# Patient Record
Sex: Female | Born: 1995 | Race: White | Hispanic: No | Marital: Single | Smoking: Never smoker
Health system: Southern US, Community
[De-identification: ages and names within clinical notes are randomized; demographics above are authoritative.]

## PROBLEM LIST (undated history)

## (undated) DIAGNOSIS — F419 Anxiety disorder, unspecified: Secondary | ICD-10-CM

## (undated) DIAGNOSIS — F32A Depression, unspecified: Secondary | ICD-10-CM

## (undated) DIAGNOSIS — F329 Major depressive disorder, single episode, unspecified: Secondary | ICD-10-CM

## (undated) DIAGNOSIS — G43909 Migraine, unspecified, not intractable, without status migrainosus: Secondary | ICD-10-CM

## (undated) DIAGNOSIS — K219 Gastro-esophageal reflux disease without esophagitis: Secondary | ICD-10-CM

## (undated) DIAGNOSIS — F909 Attention-deficit hyperactivity disorder, unspecified type: Secondary | ICD-10-CM

## (undated) HISTORY — DX: Anxiety disorder, unspecified: F41.9

## (undated) HISTORY — DX: Depression, unspecified: F32.A

---
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
## 2015-06-09 ENCOUNTER — Telehealth (INDEPENDENT_AMBULATORY_CARE_PROVIDER_SITE_OTHER): Payer: Self-pay | Admitting: Internal Medicine

## 2015-06-09 DIAGNOSIS — J0120 Acute ethmoidal sinusitis, unspecified: Secondary | ICD-10-CM

## 2015-06-09 MED ORDER — LEVOFLOXACIN 500 MG PO TABS: 500.0000 mg | ORAL_TABLET | Freq: Every day | ORAL | Status: DC

## 2015-06-09 NOTE — Telephone Encounter (Signed)
Rx sent to her pharmacy 

## 2015-06-22 LAB — WBC
Granulocyte count absolute: 61
HCT: 37 (ref 29–41)
Hemoglobin: 12.1
MCH: 27
MCHC: 32.4
MCV: 83.5 (ref 76–111)
MPV: 9.4 fL (ref 7.5–11.5)
RBC: 4.48
RDW: 14.6
WBC: 6.2
platelet count: 241

## 2015-06-22 LAB — COMPREHENSIVE METABOLIC PANEL
ALT: 9 (ref 3–30)
AST: 14
Albumin: 4.5
Alkaline Phosphatase: 57
BUN: 10 (ref 4–21)
CO2: 26
Calcium: 9.5
Chloride: 107
Creat: 0.73
Glucose: 86
Potassium: 4.2
Sodium: 139
TSH: 1.046
Total Bilirubin: 0.6
Total Protein: 7.5 (ref 6.4–8.2)

## 2015-08-11 DIAGNOSIS — F419 Anxiety disorder, unspecified: Secondary | ICD-10-CM | POA: Diagnosis not present

## 2015-08-11 DIAGNOSIS — G43001 Migraine without aura, not intractable, with status migrainosus: Secondary | ICD-10-CM | POA: Diagnosis not present

## 2015-08-11 DIAGNOSIS — F329 Major depressive disorder, single episode, unspecified: Secondary | ICD-10-CM | POA: Diagnosis not present

## 2015-10-12 DIAGNOSIS — F909 Attention-deficit hyperactivity disorder, unspecified type: Secondary | ICD-10-CM | POA: Diagnosis not present

## 2015-10-12 DIAGNOSIS — F419 Anxiety disorder, unspecified: Secondary | ICD-10-CM | POA: Diagnosis not present

## 2015-10-12 DIAGNOSIS — G43909 Migraine, unspecified, not intractable, without status migrainosus: Secondary | ICD-10-CM | POA: Diagnosis not present

## 2015-10-12 DIAGNOSIS — F329 Major depressive disorder, single episode, unspecified: Secondary | ICD-10-CM | POA: Diagnosis not present

## 2015-10-15 ENCOUNTER — Telehealth (INDEPENDENT_AMBULATORY_CARE_PROVIDER_SITE_OTHER): Payer: Self-pay | Admitting: Internal Medicine

## 2015-10-15 DIAGNOSIS — J0120 Acute ethmoidal sinusitis, unspecified: Secondary | ICD-10-CM

## 2015-10-15 DIAGNOSIS — J020 Streptococcal pharyngitis: Secondary | ICD-10-CM

## 2015-10-15 MED ORDER — LEVOFLOXACIN 500 MG PO TABS: 500.0000 mg | ORAL_TABLET | Freq: Every day | ORAL | Status: DC

## 2015-10-15 NOTE — Telephone Encounter (Signed)
Rx for Levaquin sent to her pharmacy

## 2016-02-03 DIAGNOSIS — M25562 Pain in left knee: Secondary | ICD-10-CM | POA: Diagnosis not present

## 2016-02-06 DIAGNOSIS — M25562 Pain in left knee: Secondary | ICD-10-CM | POA: Diagnosis not present

## 2016-02-08 DIAGNOSIS — F900 Attention-deficit hyperactivity disorder, predominantly inattentive type: Secondary | ICD-10-CM | POA: Diagnosis not present

## 2016-02-08 DIAGNOSIS — F419 Anxiety disorder, unspecified: Secondary | ICD-10-CM | POA: Diagnosis not present

## 2016-02-08 DIAGNOSIS — G43909 Migraine, unspecified, not intractable, without status migrainosus: Secondary | ICD-10-CM | POA: Diagnosis not present

## 2016-04-29 ENCOUNTER — Telehealth (INDEPENDENT_AMBULATORY_CARE_PROVIDER_SITE_OTHER): Payer: Self-pay | Admitting: Internal Medicine

## 2016-04-29 MED ORDER — AZITHROMYCIN 250 MG PO TABS: ORAL_TABLET | ORAL | 0 refills | Status: DC

## 2016-05-04 NOTE — Telephone Encounter (Signed)
[email or web address] 

## 2016-08-02 ENCOUNTER — Encounter (INDEPENDENT_AMBULATORY_CARE_PROVIDER_SITE_OTHER): Payer: Self-pay

## 2016-08-02 ENCOUNTER — Encounter: Payer: Self-pay | Admitting: Adult Health

## 2016-08-02 ENCOUNTER — Ambulatory Visit (INDEPENDENT_AMBULATORY_CARE_PROVIDER_SITE_OTHER): Payer: 59 | Admitting: Adult Health

## 2016-08-02 VITALS — BP 122/60 | HR 78 | Ht 70.0 in | Wt 208.5 lb

## 2016-08-02 DIAGNOSIS — Z3202 Encounter for pregnancy test, result negative: Secondary | ICD-10-CM | POA: Diagnosis not present

## 2016-08-02 DIAGNOSIS — F329 Major depressive disorder, single episode, unspecified: Secondary | ICD-10-CM | POA: Diagnosis not present

## 2016-08-02 DIAGNOSIS — N946 Dysmenorrhea, unspecified: Secondary | ICD-10-CM | POA: Diagnosis not present

## 2016-08-02 DIAGNOSIS — N920 Excessive and frequent menstruation with regular cycle: Secondary | ICD-10-CM

## 2016-08-02 DIAGNOSIS — Z30011 Encounter for initial prescription of contraceptive pills: Secondary | ICD-10-CM

## 2016-08-02 DIAGNOSIS — F32A Depression, unspecified: Secondary | ICD-10-CM

## 2016-08-02 LAB — POCT URINE PREGNANCY: PREG TEST UR: NEGATIVE

## 2016-08-02 MED ORDER — NORETHIN-ETH ESTRAD-FE BIPHAS 1 MG-10 MCG / 10 MCG PO TABS: 1.0000 | ORAL_TABLET | Freq: Every day | ORAL | 4 refills | Status: DC

## 2016-08-02 NOTE — Patient Instructions (Signed)
Start lo loestrin today Use condoms Follow up with me in 3 months

## 2016-08-02 NOTE — Progress Notes (Signed)
Subjective:     Patient ID: Kim [name], female   DOB: 05-19-1996, 20 y.o.   MRN: [id number]  HPI Kim [name] is a 21 year old white female in complaining of heavy periods and cramps, and wants to start birth control pills. Periods sometimes last 2 weeks and she may change every 2-3 hours tampons and pads.[place]friend in [place], so not having sex at present..   Review of Systems +heavy periods with cramps Reviewed past medical,surgical, social and family history. Reviewed medications and allergies.     Objective:   Physical Exam BP 122/60 (BP Location: Left Arm, Patient Position: Sitting, Cuff Size: Large)   Pulse 78   Ht 5\' 10"  (1.778 m)   Wt 208 lb 8 oz (94.6 kg)   LMP 07/31/2016 (Exact Date)   BMI 29.92 kg/m UPT negative. Skin warm and dry. Neck: mid line trachea, normal thyroid, good ROM, no lymphadenopathy noted. Lungs: clear to ausculation bilaterally. Cardiovascular: regular rate and rhythm. PHQ 9 score 16, she is on Celexa from Dr Juanetta GoslingHawkins and denies any suicidal or homicidal ideations, seems worse with periods.   Discussed risk and benefits of OCs and will start with lo loestrin. Face time 20 minutes with 50% counseling.  Assessment:     1. Menorrhagia with regular cycle   2. Dysmenorrhea   3. Encounter for initial prescription of contraceptive pills   4. Depression, unspecified depression type   5. Pregnancy examination or test, negative result       Plan:     Rx lo loestrin disp 3 packs, with 4 refills, start today, 3 packs given lot [id number] A exp 3/18 Use condoms Follow up with me in 3 months

## 2016-10-07 ENCOUNTER — Ambulatory Visit (HOSPITAL_COMMUNITY)
Admission: EM | Admit: 2016-10-07 | Discharge: 2016-10-07 | Disposition: A | Discharge status: Home / Self Care | Payer: 59 | Attending: Internal Medicine | Admitting: Internal Medicine

## 2016-10-07 ENCOUNTER — Encounter (HOSPITAL_COMMUNITY): Payer: Self-pay | Admitting: Family Medicine

## 2016-10-07 DIAGNOSIS — R112 Nausea with vomiting, unspecified: Secondary | ICD-10-CM

## 2016-10-07 DIAGNOSIS — G43009 Migraine without aura, not intractable, without status migrainosus: Secondary | ICD-10-CM | POA: Diagnosis not present

## 2016-10-07 MED ORDER — PROMETHAZINE HCL 25 MG PO TABS: 25.0000 mg | ORAL_TABLET | Freq: Four times a day (QID) | ORAL | 0 refills | Status: DC | PRN

## 2016-10-07 MED ORDER — ONDANSETRON 4 MG PO TBDP
4.0000 mg | ORAL_TABLET | Freq: Once | ORAL | Status: AC
  Administered 2016-10-07: 4 mg via ORAL

## 2016-10-07 MED ORDER — SUMATRIPTAN SUCCINATE 25 MG PO TABS: 25.0000 mg | ORAL_TABLET | ORAL | 0 refills | Status: DC | PRN

## 2016-10-07 MED ORDER — SODIUM CHLORIDE 0.9 % IV BOLUS (SEPSIS)
1000.0000 mL | Freq: Once | INTRAVENOUS | Status: AC
  Administered 2016-10-07: 1000 mL via INTRAVENOUS

## 2016-10-07 MED ORDER — DEXAMETHASONE SODIUM PHOSPHATE 10 MG/ML IJ SOLN
10.0000 mg | Freq: Once | INTRAMUSCULAR | Status: AC
  Administered 2016-10-07: 10 mg via INTRAVENOUS

## 2016-10-07 MED ORDER — KETOROLAC TROMETHAMINE 30 MG/ML IJ SOLN
30.0000 mg | Freq: Once | INTRAMUSCULAR | Status: AC
  Administered 2016-10-07: 30 mg via INTRAVENOUS

## 2016-10-07 MED ORDER — DEXAMETHASONE SODIUM PHOSPHATE 10 MG/ML IJ SOLN
INTRAMUSCULAR | Status: AC
  Filled 2016-10-07: qty 1

## 2016-10-07 MED ORDER — KETOROLAC TROMETHAMINE 30 MG/ML IJ SOLN
INTRAMUSCULAR | Status: AC
Start: 2016-10-07 — End: 2016-10-07
  Filled 2016-10-07: qty 1

## 2016-10-07 MED ORDER — ONDANSETRON 4 MG PO TBDP
ORAL_TABLET | ORAL | Status: AC
  Filled 2016-10-07: qty 1

## 2016-10-07 MED ORDER — METOCLOPRAMIDE HCL 5 MG/ML IJ SOLN
5.0000 mg | Freq: Once | INTRAMUSCULAR | Status: AC
  Administered 2016-10-07: 5 mg via INTRAVENOUS

## 2016-10-07 MED ORDER — METOCLOPRAMIDE HCL 5 MG/ML IJ SOLN
INTRAMUSCULAR | Status: AC
  Filled 2016-10-07: qty 2

## 2016-10-07 MED FILL — SUMATRIPTAN SUCC 25 MG TAB: 25 | 30 days supply | Qty: 8 | Fill #0

## 2016-10-07 MED FILL — PROMETHAZINE 25 MG TABLET: 25 | 3 days supply | Qty: 10 | Fill #0

## 2016-10-07 NOTE — ED Triage Notes (Addendum)
Pt here for migraine that started yesterday. sts N,V, photophobia. Sts also numbness on the left side and hx of the same with migraines. sts she has tried Imitrex and tramadol for pain.

## 2016-10-07 NOTE — ED Provider Notes (Signed)
CSN: [id number]     Arrival date & time 10/07/16  1425 History   First MD Initiated Contact with Patient 10/07/16 1458     Chief Complaint  Patient presents with  . Migraine   (Consider location/radiation/quality/duration/timing/severity/associated sxs/prior Treatment) HPI Kim [name] is a 21 y.o. female presenting to UC with mother with c/o gradually worsening migraine headache that started yesterday.  Pain feels similar to prior migraines but more persistent, associated nausea and vomiting. Yesterday she took the last of her Imetrex and some tramadol, which helped with the pain enough for her to sleep through the night.  This morning pain was worse on the Left side of her head with some numbness to Left side of face, normal for pt. Pt had a CT scan and full workup by ED provider and her PCP last year when she had similar symptoms.  Mother notes pt has been under increased stress recently trying to complete her GED.  She took a test earlier in the week and learned she did not pass. Pt also started a new birth control OCP.  Mother thinks both of these factors have contributed to pt's current migraine.    Past Medical History:  Diagnosis Date  . ADHD (attention deficit hyperactivity disorder)   . Anxiety   . Migraines    History reviewed. No pertinent surgical history. Family History  Problem Relation Age of Onset  . Cancer Paternal Grandfather   . Cancer Paternal Grandmother   . Cancer Maternal Grandmother   . Cancer Maternal Grandfather   . Cancer Father     lung  . Hypertension Mother    Social History  Substance Use Topics  . Smoking status: Never Smoker  . Smokeless tobacco: Never Used  . Alcohol use No   OB History    Gravida Para Term Preterm AB Living   0 0 0 0 0 0   SAB TAB Ectopic Multiple Live Births   0 0 0 0 0     Review of Systems  Constitutional: Negative for chills and fever.  Eyes: Positive for photophobia. Negative for pain, discharge, redness and  visual disturbance.  Gastrointestinal: Positive for nausea and vomiting. Negative for abdominal pain and diarrhea.  Musculoskeletal: Negative for myalgias, neck pain and neck stiffness.  Skin: Negative for color change and wound.  Neurological: Positive for numbness and headaches. Negative for dizziness, syncope, facial asymmetry, weakness and light-headedness.    Allergies  Amoxicillin and Amoxicillin  Home Medications   Prior to Admission medications   Medication Sig Start Date End Date Taking? Authorizing Provider  acetaminophen (TYLENOL) 500 MG tablet Take 500 mg by mouth every 6 (six) hours as needed.    Historical Provider, MD  citalopram (CELEXA) 20 MG tablet Take 20 mg by mouth daily.    Historical Provider, MD  Norethindrone-Ethinyl Estradiol-Fe Biphas (LO LOESTRIN FE) 1 MG-10 MCG / 10 MCG tablet Take 1 tablet by mouth daily. Take 1 daily by mouth 08/02/16   Adline Potter, NP  promethazine (PHENERGAN) 25 MG tablet Take 1 tablet (25 mg total) by mouth every 6 (six) hours as needed for nausea or vomiting. 10/07/16   Junius Finner, PA-C  SUMAtriptan (IMITREX) 25 MG tablet Take 1 tablet (25 mg total) by mouth every 2 (two) hours as needed for migraine. May repeat in 2 hours once if headache persists or recurs. 10/07/16   Junius Finner, PA-C  UNABLE TO FIND daily. Vit B,C,D3,Zinc    Historical Provider, MD  Meds Ordered and Administered this Visit   Medications  ketorolac (TORADOL) 30 MG/ML injection 30 mg (30 mg Intravenous Given 10/07/16 1516)  metoCLOPramide (REGLAN) injection 5 mg (5 mg Intravenous Given 10/07/16 1515)  dexamethasone (DECADRON) injection 10 mg (10 mg Intravenous Given 10/07/16 1515)  sodium chloride 0.9 % bolus 1,000 mL (1,000 mLs Intravenous Given 10/07/16 1516)  ondansetron (ZOFRAN-ODT) disintegrating tablet 4 mg (4 mg Oral Given 10/07/16 1635)    BP 130/79   Pulse 90   Temp 98.6 F (37 C)   Resp 18   LMP 09/21/2016   SpO2 100%  No data  found.   Physical Exam  Constitutional: She is oriented to person, place, and time. She appears well-developed and well-nourished. She appears distressed.  Pt lying in darkened exam room on bed, appears uncomfortable but is awake and alert.  HENT:  Head: Normocephalic and atraumatic.  Right Ear: Tympanic membrane normal.  Left Ear: Tympanic membrane normal.  Nose: Nose normal.  Mouth/Throat: Uvula is midline, oropharynx is clear and moist and mucous membranes are normal.  Eyes: EOM are normal.  Neck: Normal range of motion. Neck supple.  No nuchal rigidity or meningeal signs.   Cardiovascular: Normal rate and regular rhythm.   Pulmonary/Chest: Effort normal and breath sounds normal. No respiratory distress. She has no wheezes. She has no rales.  Abdominal: Soft. She exhibits no distension. There is no tenderness.  Musculoskeletal: Normal range of motion.  Neurological: She is alert and oriented to person, place, and time.  Skin: Skin is warm and dry. She is not diaphoretic.  Psychiatric: She has a normal mood and affect. Her behavior is normal.  Nursing note and vitals reviewed.   Urgent Care Course     Procedures (including critical care time)  Labs Review Labs Reviewed - No data to display  Imaging Review No results found.    MDM   1. Migraine without aura and without status migrainosus, not intractable   2. Nausea and vomiting in adult patient    Hx and exam c/w migraine headache.  Migraine cocktail of Toradol , Reglan , and Decadron  given in UC with 1L IV fluids. Pt allowed to rest in cool dark room.   4:30 PM pt is feeling better now. HA is nearly resolved but nausea still present.  Zofran given Able to keep down several ounces of PO fluids and crackers  Mother comfortable bringing pt home. Rx: Sumatriptan (8 tabs) refilled until able to f/u with PCP or neurologist. Phenergan also prescribed for nausea.  Discussed symptoms that warrant  emergent care in the ED.     Junius Finner, PA-C 10/07/16 1655

## 2016-10-26 DIAGNOSIS — H5213 Myopia, bilateral: Secondary | ICD-10-CM | POA: Diagnosis not present

## 2016-10-27 ENCOUNTER — Telehealth (INDEPENDENT_AMBULATORY_CARE_PROVIDER_SITE_OTHER): Payer: Self-pay | Admitting: Internal Medicine

## 2016-10-27 DIAGNOSIS — B379 Candidiasis, unspecified: Secondary | ICD-10-CM

## 2016-10-27 MED ORDER — FLUCONAZOLE 100 MG PO TABS: 100.0000 mg | ORAL_TABLET | Freq: Every day | ORAL | 0 refills | Status: DC

## 2016-10-27 NOTE — Telephone Encounter (Signed)
Rx sent 

## 2016-10-28 ENCOUNTER — Telehealth: Payer: Self-pay | Admitting: Adult Health

## 2016-10-28 NOTE — Telephone Encounter (Signed)
Informed pt she can come by here and pick up 1 month sample and also RX was sent to pharmacy back in January.  Pt verbalized understanding.

## 2016-10-31 ENCOUNTER — Ambulatory Visit: Payer: 59 | Admitting: Adult Health

## 2016-11-07 ENCOUNTER — Encounter: Payer: Self-pay | Admitting: Adult Health

## 2016-11-07 ENCOUNTER — Ambulatory Visit (INDEPENDENT_AMBULATORY_CARE_PROVIDER_SITE_OTHER): Payer: 59 | Admitting: Adult Health

## 2016-11-07 VITALS — BP 126/70 | HR 79 | Ht 71.0 in | Wt 206.0 lb

## 2016-11-07 DIAGNOSIS — Z308 Encounter for other contraceptive management: Secondary | ICD-10-CM

## 2016-11-07 DIAGNOSIS — Z7689 Persons encountering health services in other specified circumstances: Secondary | ICD-10-CM

## 2016-11-07 NOTE — Progress Notes (Signed)
Subjective:     Patient ID: Beverely Low, female   DOB: [birth year], 21 y.o.   MRN: [id number]  HPI Roanne is a 21 year old white female, back in follow up of starting lo loestrin in January and periods much better.   Review of Systems Periods better Reviewed past medical,surgical, social and family history. Reviewed medications and allergies.     Objective:   Physical Exam BP 126/70 (BP Location: Right Arm, Patient Position: Sitting, Cuff Size: Normal)   Pulse 79   Ht  (1.803 m)   Wt 206 lb (93.4 kg)   LMP 10/20/2016 (Approximate)   BMI 28.73 kg/m    Has been on lo loestrin since end of January and periods much better, but ran out of pills and forgot to go get at pharmacy.  Assessment:     1. Encounter for menstrual regulation       Plan:     Continue lo loestrin  Return in 3 months for pap and physical

## 2017-02-06 ENCOUNTER — Other Ambulatory Visit: Payer: 59 | Admitting: Adult Health

## 2017-02-24 ENCOUNTER — Other Ambulatory Visit: Payer: 59 | Admitting: Adult Health

## 2017-02-28 DIAGNOSIS — R112 Nausea with vomiting, unspecified: Secondary | ICD-10-CM | POA: Diagnosis not present

## 2017-02-28 DIAGNOSIS — G43909 Migraine, unspecified, not intractable, without status migrainosus: Secondary | ICD-10-CM | POA: Diagnosis not present

## 2017-03-27 ENCOUNTER — Other Ambulatory Visit (HOSPITAL_COMMUNITY)
Admission: RE | Admit: 2017-03-27 | Discharge: 2017-03-27 | Disposition: A | Discharge status: Home / Self Care | Payer: 59 | Source: Ambulatory Visit | Attending: Adult Health | Admitting: Adult Health

## 2017-03-27 ENCOUNTER — Encounter: Payer: Self-pay | Admitting: Adult Health

## 2017-03-27 ENCOUNTER — Ambulatory Visit (INDEPENDENT_AMBULATORY_CARE_PROVIDER_SITE_OTHER): Payer: 59 | Admitting: Adult Health

## 2017-03-27 VITALS — BP 120/70 | HR 74 | Ht 69.2 in | Wt 198.0 lb

## 2017-03-27 DIAGNOSIS — Z01419 Encounter for gynecological examination (general) (routine) without abnormal findings: Secondary | ICD-10-CM

## 2017-03-27 DIAGNOSIS — F329 Major depressive disorder, single episode, unspecified: Secondary | ICD-10-CM | POA: Diagnosis not present

## 2017-03-27 DIAGNOSIS — R1032 Left lower quadrant pain: Secondary | ICD-10-CM | POA: Diagnosis not present

## 2017-03-27 DIAGNOSIS — Z3041 Encounter for surveillance of contraceptive pills: Secondary | ICD-10-CM | POA: Diagnosis not present

## 2017-03-27 DIAGNOSIS — Z01411 Encounter for gynecological examination (general) (routine) with abnormal findings: Secondary | ICD-10-CM | POA: Diagnosis not present

## 2017-03-27 DIAGNOSIS — Z0001 Encounter for general adult medical examination with abnormal findings: Secondary | ICD-10-CM | POA: Insufficient documentation

## 2017-03-27 DIAGNOSIS — F32A Depression, unspecified: Secondary | ICD-10-CM

## 2017-03-27 HISTORY — DX: Encounter for surveillance of contraceptive pills: Z30.41

## 2017-03-27 HISTORY — DX: Encounter for gynecological examination (general) (routine) without abnormal findings: Z01.419

## 2017-03-27 MED ORDER — NORETHIN-ETH ESTRAD-FE BIPHAS 1 MG-10 MCG / 10 MCG PO TABS: 1.0000 | ORAL_TABLET | Freq: Every day | ORAL | 4 refills | Status: DC

## 2017-03-27 NOTE — Patient Instructions (Signed)
Call PCP about meds Return in 1 week for [place]  Physical in 1 year  Pap in 3 if normal

## 2017-03-27 NOTE — Progress Notes (Signed)
Patient ID: Kim [name], female   DOB: [birth year], 21 y.o.   MRN: [id number] History of Present Illness: Kim [name] is a 21 year old white female in for well woman gyn exam and first pap. PCP is Dr Kim [name]   Current Medications, Allergies, Past Medical History, Past Surgical History, Family History and Social History were reviewed in [employer] electronic medical record.     Review of Systems:  Patient denies any headaches, hearing loss, fatigue, blurred vision, shortness of breath, chest pain, problems with bowel movements, urination, or intercourse. No joint pain or mood swings. Has some pain LLQ, long standing.   Physical Exam:BP 120/70 (BP Location: Left Arm, Patient Position: Sitting, Cuff Size: Small)   Pulse 74   Ht 5' 9.2" (1.758 m)   Wt 198 lb (89.8 kg)   LMP 03/21/2017   BMI 29.07 kg/m  General:  Well developed, well nourished, no acute distress Skin:  Warm and dry Neck:  Midline trachea, normal thyroid, good ROM, no lymphadenopathy Lungs; Clear to auscultation bilaterally Breast:  No dominant palpable mass, retraction, or nipple discharge Cardiovascular: Regular rate and rhythm Abdomen:  Soft, non tender, no hepatosplenomegaly Pelvic:  External genitalia is normal in appearance, no lesions.  The vagina is normal in appearance. Urethra has no lesions or masses. The cervix is smooth, pap with GC/CHL performed.  Uterus is felt to be normal size, shape, and contour.  No adnexal masses, LLQ  tenderness noted.Bladder is non tender, no masses felt. Extremities/musculoskeletal:  No swelling or varicosities noted, no clubbing or cyanosis Psych:  No mood changes, alert and cooperative,seems happy PHQ 9 score 17, is on meds, from PCP, is not suicidal. Will get [place] to evaluate LLQ.  Impression:  1. Encounter for gynecological examination with Papanicolaou smear of cervix   2. Encounter for surveillance of contraceptive pills   3. LLQ pain   4. Depression, unspecified  depression type      Plan: Meds ordered this encounter  Medications  . Norethindrone-Ethinyl Estradiol-Fe Biphas (LO LOESTRIN FE) 1 MG-10 MCG / 10 MCG tablet    Sig: Take 1 tablet by mouth daily. Take 1 daily by mouth    Dispense:  3 Package    Refill:  4    BIN [id number], PCN [id number], GRP [id number]    Order Specific Question:   Supervising Provider    Answer:   Lazaro Arms [[id number]]   Call PCP about meds Return in 1 week for [place]  Physical in 1 year  Pap in 3 if normal

## 2017-03-27 NOTE — Addendum Note (Signed)
Addended by: Federico Flake A on: 03/27/2017 04:50 PM   Modules accepted: Orders

## 2017-03-29 LAB — CYTOLOGY - PAP
CHLAMYDIA, DNA PROBE: NEGATIVE
DIAGNOSIS: NEGATIVE
NEISSERIA GONORRHEA: NEGATIVE

## 2017-03-31 ENCOUNTER — Telehealth: Payer: Self-pay | Admitting: *Deleted

## 2017-03-31 NOTE — Telephone Encounter (Signed)
Pt's mom called stating Lo Loestrin requires a PA. I haven't seen a PA from pharmacy yet. I gave 2 sample boxes of Lo Loestrin. Lot # [id number] A exp 1/20. Pt's mom to come by and pick up today at front desk. JSY

## 2017-04-03 ENCOUNTER — Ambulatory Visit (INDEPENDENT_AMBULATORY_CARE_PROVIDER_SITE_OTHER): Payer: 59

## 2017-04-03 DIAGNOSIS — R1032 Left lower quadrant pain: Secondary | ICD-10-CM

## 2017-04-03 NOTE — Progress Notes (Signed)
PELVIC US TA/TV: homogeneous anteverted uterus,wnl,normal ovaries bilat,unable to slide left ovary w/probe pressure,right ovary appears mobile,no free fluid,bilat adnexal pain during ultrasound,EEC 2.2 mm

## 2017-04-04 ENCOUNTER — Telehealth: Payer: Self-pay | Admitting: Adult Health

## 2017-04-04 MED ORDER — CITALOPRAM HYDROBROMIDE 20 MG PO TABS: ORAL_TABLET | ORAL | 3 refills | Status: DC

## 2017-04-04 NOTE — Telephone Encounter (Signed)
Pt aware [place] is normal, she says PCP out of town and she is on celexa and needs refills and may need increase in dose, will rx celexa 20 mg take 1 1/2 tabs daily and let me know how it is working

## 2017-04-19 ENCOUNTER — Telehealth: Payer: Self-pay | Admitting: Adult Health

## 2017-04-19 NOTE — Telephone Encounter (Signed)
Pt to come get samples

## 2017-07-06 ENCOUNTER — Telehealth: Payer: Self-pay | Admitting: Adult Health

## 2017-07-06 NOTE — Telephone Encounter (Signed)
Patient called stating that she would like a call back from Jennifer, Pt did not state the reason for the call. Please contact pt °

## 2017-07-06 NOTE — Telephone Encounter (Signed)
Patient states she has taken "Plan B" today after she and her boyfriend had unprotected sex. She takes her BC regularly at the same time daily and is due for her period in 3 days. She had questions regarding the medication which were answered and advised just to let us know if she needed to be seen. Verbalized understanding.

## 2017-09-22 DIAGNOSIS — Z01419 Encounter for gynecological examination (general) (routine) without abnormal findings: Secondary | ICD-10-CM | POA: Diagnosis not present

## 2017-09-22 DIAGNOSIS — Z124 Encounter for screening for malignant neoplasm of cervix: Secondary | ICD-10-CM | POA: Diagnosis not present

## 2017-09-26 LAB — HM PAP SMEAR

## 2017-10-12 DIAGNOSIS — Z3202 Encounter for pregnancy test, result negative: Secondary | ICD-10-CM | POA: Diagnosis not present

## 2017-10-12 DIAGNOSIS — Z30430 Encounter for insertion of intrauterine contraceptive device: Secondary | ICD-10-CM | POA: Diagnosis not present

## 2017-11-10 ENCOUNTER — Other Ambulatory Visit: Payer: Self-pay | Admitting: Adult Health

## 2017-11-14 DIAGNOSIS — Z30431 Encounter for routine checking of intrauterine contraceptive device: Secondary | ICD-10-CM | POA: Diagnosis not present

## 2018-02-19 ENCOUNTER — Telehealth (INDEPENDENT_AMBULATORY_CARE_PROVIDER_SITE_OTHER): Payer: Self-pay | Admitting: Internal Medicine

## 2018-02-19 DIAGNOSIS — K588 Other irritable bowel syndrome: Secondary | ICD-10-CM

## 2018-02-19 MED ORDER — DICYCLOMINE HCL 10 MG PO CAPS: 10.0000 mg | ORAL_CAPSULE | Freq: Two times a day (BID) | ORAL | 1 refills | Status: DC

## 2018-02-19 NOTE — Telephone Encounter (Signed)
Rx for Dicyclomine sent to her pharmacy.  

## 2018-03-16 ENCOUNTER — Telehealth (INDEPENDENT_AMBULATORY_CARE_PROVIDER_SITE_OTHER): Payer: Self-pay | Admitting: Internal Medicine

## 2018-03-16 DIAGNOSIS — J020 Streptococcal pharyngitis: Secondary | ICD-10-CM

## 2018-03-16 MED ORDER — LEVOFLOXACIN 500 MG PO TABS: 500.0000 mg | ORAL_TABLET | Freq: Every day | ORAL | 0 refills | Status: DC

## 2018-03-16 NOTE — Telephone Encounter (Signed)
Rx sent to her pharmacy 

## 2018-04-27 ENCOUNTER — Encounter (HOSPITAL_COMMUNITY): Payer: Self-pay | Admitting: Emergency Medicine

## 2018-04-27 ENCOUNTER — Other Ambulatory Visit: Payer: Self-pay

## 2018-04-27 ENCOUNTER — Emergency Department (HOSPITAL_COMMUNITY)
Admission: EM | Admit: 2018-04-27 | Discharge: 2018-04-28 | Disposition: A | Discharge status: Home / Self Care | Payer: 59 | Attending: Emergency Medicine | Admitting: Emergency Medicine

## 2018-04-27 DIAGNOSIS — R0789 Other chest pain: Secondary | ICD-10-CM

## 2018-04-27 DIAGNOSIS — M940 Chondrocostal junction syndrome [Tietze]: Secondary | ICD-10-CM | POA: Diagnosis not present

## 2018-04-27 DIAGNOSIS — Z79899 Other long term (current) drug therapy: Secondary | ICD-10-CM | POA: Insufficient documentation

## 2018-04-27 DIAGNOSIS — F419 Anxiety disorder, unspecified: Secondary | ICD-10-CM | POA: Insufficient documentation

## 2018-04-27 DIAGNOSIS — F909 Attention-deficit hyperactivity disorder, unspecified type: Secondary | ICD-10-CM | POA: Diagnosis not present

## 2018-04-27 MED ORDER — CYCLOBENZAPRINE HCL 10 MG PO TABS
5.0000 mg | ORAL_TABLET | Freq: Once | ORAL | Status: AC
  Administered 2018-04-27: 5 mg via ORAL
  Filled 2018-04-27: qty 1

## 2018-04-27 MED ORDER — IBUPROFEN 400 MG PO TABS
600.0000 mg | ORAL_TABLET | Freq: Once | ORAL | Status: AC
  Administered 2018-04-27: 600 mg via ORAL
  Filled 2018-04-27: qty 2

## 2018-04-27 NOTE — ED Triage Notes (Addendum)
Pt c/o sudden chest stabbing to left side with lightheadedness with getting up. Pt states had syncopal episode. Witness states was out about 10 seconds . Pt a/o. Non diaphoretic. Pt anxious in triage

## 2018-04-28 DIAGNOSIS — F419 Anxiety disorder, unspecified: Secondary | ICD-10-CM | POA: Diagnosis not present

## 2018-04-28 DIAGNOSIS — Z79899 Other long term (current) drug therapy: Secondary | ICD-10-CM | POA: Diagnosis not present

## 2018-04-28 DIAGNOSIS — F909 Attention-deficit hyperactivity disorder, unspecified type: Secondary | ICD-10-CM | POA: Diagnosis not present

## 2018-04-28 DIAGNOSIS — M940 Chondrocostal junction syndrome [Tietze]: Secondary | ICD-10-CM | POA: Diagnosis not present

## 2018-04-28 LAB — COMPREHENSIVE METABOLIC PANEL
ALT: 11 U/L (ref 0–44)
ANION GAP: 5 (ref 5–15)
AST: 14 U/L — ABNORMAL LOW (ref 15–41)
Albumin: 4.2 g/dL (ref 3.5–5.0)
Alkaline Phosphatase: 53 U/L (ref 38–126)
BILIRUBIN TOTAL: 0.6 mg/dL (ref 0.3–1.2)
BUN: 9 mg/dL (ref 6–20)
CHLORIDE: 108 mmol/L (ref 98–111)
CO2: 25 mmol/L (ref 22–32)
Calcium: 8.8 mg/dL — ABNORMAL LOW (ref 8.9–10.3)
Creatinine, Ser: 0.62 mg/dL (ref 0.44–1.00)
GFR calc Af Amer: >60 mL/min (ref >60)
Glucose, Bld: 105 mg/dL — ABNORMAL HIGH (ref 70–99)
POTASSIUM: 3.7 mmol/L (ref 3.5–5.1)
Sodium: 138 mmol/L (ref 135–145)
TOTAL PROTEIN: 7.4 g/dL (ref 6.5–8.1)

## 2018-04-28 LAB — CBC WITH DIFFERENTIAL/PLATELET
ABS IMMATURE GRANULOCYTES: 0.02 10*3/uL (ref 0.00–0.07)
BASOS ABS: 0 10*3/uL (ref 0.0–0.1)
BASOS PCT: 1 %
Eosinophils Absolute: 0.1 10*3/uL (ref 0.0–0.5)
Eosinophils Relative: 1 %
HCT: 37.8 % (ref 36.0–46.0)
Hemoglobin: 12.3 g/dL (ref 12.0–15.0)
IMMATURE GRANULOCYTES: 0 %
LYMPHS PCT: 24 %
Lymphs Abs: 2.1 10*3/uL (ref 0.7–4.0)
MCH: 29.4 pg (ref 26.0–34.0)
MCHC: 32.5 g/dL (ref 30.0–36.0)
MCV: 90.2 fL (ref 80.0–100.0)
Monocytes Absolute: 0.4 10*3/uL (ref 0.1–1.0)
Monocytes Relative: 5 %
NEUTROS ABS: 6.1 10*3/uL (ref 1.7–7.7)
NEUTROS PCT: 69 %
NRBC: 0 % (ref 0.0–0.2)
PLATELETS: 236 10*3/uL (ref 150–400)
RBC: 4.19 MIL/uL (ref 3.87–5.11)
RDW: 13 % (ref 11.5–15.5)
WBC: 8.7 10*3/uL (ref 4.0–10.5)

## 2018-04-28 LAB — D-DIMER, QUANTITATIVE (NOT AT ARMC)

## 2018-04-28 LAB — TROPONIN I

## 2018-04-28 NOTE — Discharge Instructions (Signed)
Use ice and heat for comfort.  Take ibuprofen 600 mg 4 times a day as needed for your chest wall pain.  This is a inflammation of the joints near your breastbone in your chest.  It is not a sign of anything bad such as a problem with your lungs or your heart, it is an aggravation because it is painful.  It usually comes out in times of stress.  You need to talk to your primary care provider about your Celexa and see if you need to change your antidepressant or increase your dose.

## 2018-04-28 NOTE — ED Provider Notes (Signed)
[facility] EMERGENCY DEPARTMENT Provider Note   CSN: [id number] Arrival date & time: 04/27/18  2235  Time seen 23:30 PM    History   Chief Complaint Chief Complaint  Patient presents with  . Chest Pain    HPI Kim [name] is a 22 y.o. female.  HPI patient states today she and her boyfriend went to [facility] and she felt cold, on the way home she felt cold and continued to feel cold when she got home.  About 9:30 PM she started complaining of left anterior chest pain.  She states the pain is sharp and jabbing and only last for a few seconds to a minute.  She does not have any constant pain.  She states she feels short of breath afterwards only last few seconds.  She had nausea without vomiting or diaphoresis.  She states she feels hot and feels like her face is flushed.  She states she gets swelling of her left knee and ankle from an old injury otherwise she denies any pain or swelling her legs.  She states she has been having this chest pain with physical activity for the past couple months.  She states it is in the same location and only last a few seconds.  She denies any recent travel or being in bed rest.  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  She states she has a history of depression but she does not have a psychiatrist or therapist.  She states her primary care doctor is manage it.  She has been on Celexa for about 2 years and feels like maybe her dose needs to be adjusted.  Patient denies being on hormones but she she does have a IUD.  Patient states she had a "bad panic attack" last weekend and was "physically sick".  PCP Patient, No Pcp Per   Past Medical History:  Diagnosis Date  . ADHD (attention deficit hyperactivity disorder)    . Anxiety   . Migraines     Patient Active Problem List   Diagnosis Date Noted  . Depression 03/27/2017  . Encounter for surveillance of contraceptive pills 03/27/2017  . Encounter for gynecological examination with Papanicolaou smear of cervix 03/27/2017    History reviewed. No pertinent surgical history.   OB History    Gravida  0   Para  0   Term  0   Preterm  0   AB  0   Living  0     SAB  0   TAB  0   Ectopic  0   Multiple  0   Live Births  0            Home Medications    Celexa, IUD  Prior to Admission medications   Medication Sig Start Date End Date Taking? Authorizing Provider  acetaminophen (TYLENOL) 500 MG tablet Take 500 mg by mouth every 6 (six) hours as needed.   Yes [provider]  citalopram (CELEXA) 20 MG tablet Take 1 1/2 tablets daily 04/04/17  Yes Cyril Mourning A, NP  dicyclomine (BENTYL) 10 MG capsule Take 1 capsule (10 mg total) by mouth 2 (two) times daily before a meal. 02/19/18   Setzer, Brand Males, NP  levofloxacin (LEVAQUIN) 500 MG tablet Take 1 tablet (500 mg total) by mouth daily. 03/16/18   Setzer, Brand Males, NP  Norethindrone-Ethinyl Estradiol-Fe Biphas (LO LOESTRIN FE) 1 MG-10 MCG / 10 MCG tablet Take 1 tablet by mouth daily. Take 1 daily by mouth 03/27/17   Adline Potter, NP  SUMAtriptan (IMITREX) 25 MG tablet Take 1 tablet (25 mg total) by mouth every 2 (two) hours as needed for migraine. May repeat in 2 hours once if headache persists or recurs. 10/07/16   Lurene Shadow, PA-C  UNABLE TO FIND daily. Vit B,C,D3,Zinc    [provider]  metoCLOPramide (REGLAN) 10 MG tablet Take 1 tablet (10 mg total) by mouth every 6 (six) hours as needed (take as needed for nausea or headache). 09/12/11 03/31/14  Samuel Jester, DO    Family History Family History  Problem Relation Age of Onset  . Cancer Paternal Grandfather   . Alzheimer's disease Paternal Grandmother   . Cancer Maternal Grandmother        breast     . Cancer Maternal Grandfather        pancreatic and bone  . Cancer Father        lung  . Hypertension Mother     Social History Social History   Tobacco Use  . Smoking status: Never Smoker  . Smokeless tobacco: Never Used  Substance Use Topics  . Alcohol use: Yes    Comment: monthly  . Drug use: No  quit community college last week Uses E cigarettes  Allergies   Amoxicillin and Amoxicillin   Review of Systems Review of Systems  All other systems reviewed and are negative.    Physical Exam Updated Vital Signs BP (!) 169/91   Pulse (!) 115   Temp 97.7 F (36.5 C) (Oral)   Resp 16   LMP 04/27/2018   SpO2 100%   Vital signs normal except for hypertension and tachycardia   Physical Exam  Constitutional: She is oriented to person, place, and time. She appears well-developed and well-nourished.  Non-toxic appearance. She does not appear ill. No distress.  HENT:  Head: Normocephalic and atraumatic.  Right Ear: External ear normal.  Left Ear: External ear normal.  Nose: Nose normal. No mucosal edema or rhinorrhea.  Mouth/Throat: Oropharynx is clear and moist and mucous membranes are normal. No dental abscesses or uvula swelling.  Eyes: Pupils are equal, round, and reactive to light. Conjunctivae and EOM are normal.  Neck: Normal range of motion and full passive range of motion without pain. Neck supple.  Cardiovascular: Regular rhythm and normal heart sounds. Tachycardia present. Exam reveals no gallop and no friction rub.  No murmur heard. Pulmonary/Chest: Effort normal and breath sounds normal. No respiratory distress. She has no wheezes. She has no rhonchi. She has no rales. She exhibits no tenderness and no crepitus.  Patient is tender over bilateral costochondral junctions in her left mid anterior chest wall.    Abdominal: Soft. Normal appearance and bowel sounds are normal. She exhibits no distension. There is no tenderness. There is no rebound and no  guarding.  Musculoskeletal: Normal range of motion. She exhibits no edema or tenderness.  Moves all extremities well.   Neurological: She is alert and oriented to person, place, and time. She has normal strength. No cranial nerve deficit.  Skin: Skin is warm, dry and intact. No rash noted. No erythema. No pallor.  Psychiatric: Her behavior is normal. Her mood appears anxious. Her speech is rapid  and/or pressured.  Nursing note and vitals reviewed.    ED Treatments / Results  Labs (all labs ordered are listed, but only abnormal results are displayed) Results for orders placed or performed during the hospital encounter of 04/27/18  Comprehensive metabolic panel  Result Value Ref Range   Sodium 138 135 - 145 mmol/L   Potassium 3.7 3.5 - 5.1 mmol/L   Chloride 108 98 - 111 mmol/L   CO2 25 22 - 32 mmol/L   Glucose, Bld 105 (H) 70 - 99 mg/dL   BUN 9 6 - 20 mg/dL   Creatinine, Ser 4.09 0.44 - 1.00 mg/dL   Calcium 8.8 (L) 8.9 - 10.3 mg/dL   Total Protein 7.4 6.5 - 8.1 g/dL   Albumin 4.2 3.5 - 5.0 g/dL   AST 14 (L) 15 - 41 U/L   ALT 11 0 - 44 U/L   Alkaline Phosphatase 53 38 - 126 U/L   Total Bilirubin 0.6 0.3 - 1.2 mg/dL   GFR calc non Af Amer >60 >60 mL/min   GFR calc Af Amer >60 >60 mL/min   Anion gap 5 5 - 15  CBC with Differential  Result Value Ref Range   WBC 8.7 4.0 - 10.5 K/uL   RBC 4.19 3.87 - 5.11 MIL/uL   Hemoglobin 12.3 12.0 - 15.0 g/dL   HCT 81.1 91.4 - 78.2 %   MCV 90.2 80.0 - 100.0 fL   MCH 29.4 26.0 - 34.0 pg   MCHC 32.5 30.0 - 36.0 g/dL   RDW 95.6 21.3 - 08.6 %   Platelets 236 150 - 400 K/uL   nRBC 0.0 0.0 - 0.2 %   Neutrophils Relative % 69 %   Neutro Abs 6.1 1.7 - 7.7 K/uL   Lymphocytes Relative 24 %   Lymphs Abs 2.1 0.7 - 4.0 K/uL   Monocytes Relative 5 %   Monocytes Absolute 0.4 0.1 - 1.0 K/uL   Eosinophils Relative 1 %   Eosinophils Absolute 0.1 0.0 - 0.5 K/uL   Basophils Relative 1 %   Basophils Absolute 0.0 0.0 - 0.1 K/uL   Immature Granulocytes 0  %   Abs Immature Granulocytes 0.02 0.00 - 0.07 K/uL  Troponin I  Result Value Ref Range   Troponin I <0.03 <0.03 ng/mL  D-dimer, quantitative  Result Value Ref Range   D-Dimer, Quant <0.27 0.00 - 0.50 ug/mL-FEU   Laboratory interpretation all normal    EKG EKG Interpretation  Date/Time:  Friday April 27 2018 23:00:14 EDT Ventricular Rate:  125 PR Interval:    QRS Duration: 92 QT Interval:  310 QTC Calculation: 447 R Axis:   106 Text Interpretation:  Sinus tachycardia Consider right ventricular hypertrophy no prior to compare with Confirmed by Meridee Score [phone]) on 04/27/2018 11:05:01 PM   Radiology No results found.  Procedures Procedures (including critical care time)  Medications Ordered in ED Medications  ibuprofen (ADVIL,MOTRIN) tablet 600 mg (600 mg Oral Given 04/27/18 2349)  cyclobenzaprine (FLEXERIL) tablet 5 mg (5 mg Oral Given 04/27/18 2349)     Initial Impression / Assessment and Plan / ED Course  I have reviewed the triage vital signs and the nursing notes.  Pertinent labs & imaging results that were available during my care of the patient were reviewed by me and considered in my medical decision making (see chart for details).    Patient was given ibuprofen and Flexeril for chest wall pain.  She seems to be very anxious.  Laboratory testing was  done including a d-dimer although she does not have risk factors for DVT or PE.  Patient will need to follow-up with her primary care provider about her anxiety and dose of her Celexa.  She can take nonsteroidal anti-inflammatory drugs over-the-counter for her chest wall pain.  1:30 AM patient states her pain is better.  She was discharged home.  Final Clinical Impressions(s) / ED Diagnoses   Final diagnoses:  Chest wall pain  Costochondritis, acute  Anxiety    ED Discharge Orders    None    OTC ibuprofen   Plan discharge  Devoria Albe, MD, Concha Pyo, MD 04/28/18 [phone]

## 2018-07-13 DIAGNOSIS — N390 Urinary tract infection, site not specified: Secondary | ICD-10-CM | POA: Diagnosis not present

## 2018-07-18 DIAGNOSIS — N760 Acute vaginitis: Secondary | ICD-10-CM | POA: Diagnosis not present

## 2018-07-18 DIAGNOSIS — Z113 Encounter for screening for infections with a predominantly sexual mode of transmission: Secondary | ICD-10-CM | POA: Diagnosis not present

## 2018-09-18 ENCOUNTER — Encounter (HOSPITAL_COMMUNITY): Payer: Self-pay | Admitting: Emergency Medicine

## 2018-09-18 ENCOUNTER — Ambulatory Visit (HOSPITAL_COMMUNITY)
Admission: EM | Admit: 2018-09-18 | Discharge: 2018-09-18 | Disposition: A | Discharge status: Home / Self Care | Payer: 59 | Attending: Emergency Medicine | Admitting: Emergency Medicine

## 2018-09-18 DIAGNOSIS — G43009 Migraine without aura, not intractable, without status migrainosus: Secondary | ICD-10-CM

## 2018-09-18 MED ORDER — METOCLOPRAMIDE HCL 5 MG/ML IJ SOLN
10.0000 mg | Freq: Once | INTRAMUSCULAR | Status: AC
Start: 2018-09-18 — End: 2018-09-18
  Administered 2018-09-18: 10 mg via INTRAMUSCULAR

## 2018-09-18 MED ORDER — DIPHENHYDRAMINE HCL 50 MG/ML IJ SOLN
INTRAMUSCULAR | Status: AC
  Filled 2018-09-18: qty 1

## 2018-09-18 MED ORDER — ONDANSETRON 8 MG PO TBDP: ORAL_TABLET | ORAL | 0 refills | Status: DC

## 2018-09-18 MED ORDER — SUMATRIPTAN SUCCINATE 6 MG/0.5ML ~~LOC~~ SOLN
SUBCUTANEOUS | Status: AC
  Filled 2018-09-18: qty 0.5

## 2018-09-18 MED ORDER — SUMATRIPTAN SUCCINATE 6 MG/0.5ML ~~LOC~~ SOLN
6.0000 mg | Freq: Once | SUBCUTANEOUS | Status: AC
  Administered 2018-09-18: 6 mg via SUBCUTANEOUS

## 2018-09-18 MED ORDER — KETOROLAC TROMETHAMINE 30 MG/ML IJ SOLN
30.0000 mg | Freq: Once | INTRAMUSCULAR | Status: AC
  Administered 2018-09-18: 30 mg via INTRAMUSCULAR

## 2018-09-18 MED ORDER — DEXAMETHASONE SODIUM PHOSPHATE 10 MG/ML IJ SOLN
10.0000 mg | Freq: Once | INTRAMUSCULAR | Status: AC
  Administered 2018-09-18: 10 mg via INTRAMUSCULAR

## 2018-09-18 MED ORDER — DEXAMETHASONE SODIUM PHOSPHATE 10 MG/ML IJ SOLN
INTRAMUSCULAR | Status: AC
  Filled 2018-09-18: qty 1

## 2018-09-18 MED ORDER — SUMATRIPTAN 5 MG/ACT NA SOLN: 1.0000 | NASAL | 0 refills | Status: DC | PRN

## 2018-09-18 MED ORDER — METOCLOPRAMIDE HCL 5 MG/ML IJ SOLN
INTRAMUSCULAR | Status: AC
  Filled 2018-09-18: qty 2

## 2018-09-18 MED ORDER — IBUPROFEN 600 MG PO TABS: 600.0000 mg | ORAL_TABLET | Freq: Four times a day (QID) | ORAL | 0 refills | Status: DC | PRN

## 2018-09-18 MED ORDER — KETOROLAC TROMETHAMINE 60 MG/2ML IM SOLN
INTRAMUSCULAR | Status: AC
  Filled 2018-09-18: qty 2

## 2018-09-18 MED ORDER — DIPHENHYDRAMINE HCL 50 MG/ML IJ SOLN
25.0000 mg | Freq: Once | INTRAMUSCULAR | Status: AC
  Administered 2018-09-18: 25 mg via INTRAMUSCULAR

## 2018-09-18 MED ORDER — BUTALBITAL-APAP-CAFFEINE 50-325-40 MG PO TABS: 1.0000 | ORAL_TABLET | ORAL | 0 refills | Status: DC | PRN

## 2018-09-18 MED ORDER — DIPHENHYDRAMINE HCL 25 MG PO CAPS: 50.0000 mg | ORAL_CAPSULE | Freq: Once | ORAL | Status: DC

## 2018-09-18 NOTE — ED Provider Notes (Signed)
HPI  SUBJECTIVE:  Kim [name] is a 23 y.o. female who reports a gradual onset throbbing right-sided headache located along her temple and going down her neck starting today.  It has lasted hours.  She reports nausea, vomiting, photophobia, phonophobia.  She tried Imitrex and Phenergan, but vomited this up.  No fevers, visual changes, purulent nasal d/c, nasal congestion, sinus pain/pressure, ear pain, jaw pain, dental pain,  dysarthria, focal weakness, facial droop, discoordination. No body aches, neck stiffness, rash. No mental status changes, seizures, syncope. Pt is not pregnant. No sudden onset, did not occur during exertion. States is similar to previous migraines.  States that she has been under an increased amount of stress recently which is a known trigger for her.  Past medical history negative for aneurysm, SAH/ICH, HIV, sinusitis, glaucoma, stroke, atrial fibrillation or temporal arteritis. Pt is not on any antiplatelets/anticoagulants. FH neg stroke.  LMP: 2 weeks ago.  Denies possibility being pregnant.  PMD: Dr. Herbert Deaner   Past Medical History:  Diagnosis Date  . ADHD (attention deficit hyperactivity disorder)   . Anxiety   . Migraines     History reviewed. No pertinent surgical history.  Family History  Problem Relation Age of Onset  . Cancer Paternal Grandfather   . Alzheimer's disease Paternal Grandmother   . Cancer Maternal Grandmother        breast   . Cancer Maternal Grandfather        pancreatic and bone  . Cancer Father        lung  . Hypertension Mother     Social History   Tobacco Use  . Smoking status: Never Smoker  . Smokeless tobacco: Never Used  Substance Use Topics  . Alcohol use: Yes    Comment: monthly  . Drug use: No    No current facility-administered medications for this encounter.   Current Outpatient Medications:  .  acetaminophen (TYLENOL) 500 MG tablet, Take 500 mg by mouth every 6 (six) hours as needed., Disp: , Rfl:  .   butalbital-acetaminophen-caffeine (FIORICET, ESGIC) 50-325-40 MG tablet, Take 1-2 tablets by mouth every 4 (four) hours as needed for headache. Max 6 caps/day, Disp: 20 tablet, Rfl: 0 .  citalopram (CELEXA) 20 MG tablet, Take 1 1/2 tablets daily, Disp: 45 tablet, Rfl: 3 .  ibuprofen (ADVIL,MOTRIN) 600 MG tablet, Take 1 tablet (600 mg total) by mouth every 6 (six) hours as needed., Disp: 30 tablet, Rfl: 0 .  ondansetron (ZOFRAN ODT) 8 MG disintegrating tablet, 1/2- 1 tablet q 8 hr prn nausea, vomiting, Disp: 20 tablet, Rfl: 0 .  SUMAtriptan (IMITREX) 5 MG/ACT nasal spray, Place 1 spray (5 mg total) into the nose every 2 (two) hours as needed for migraine. Do not exceed 2 doses in 24 hours, Disp: 2 Inhaler, Rfl: 0 .  UNABLE TO FIND, daily. Vit B,C,D3,Zinc, Disp: , Rfl:   Allergies  Allergen Reactions  . Amoxicillin Rash  . Amoxicillin      ROS  As noted in HPI.   Physical Exam  BP 127/75   Pulse 96   Temp 97.7 F (36.5 C)   Resp 18   SpO2 100%   Constitutional: Well developed, well nourished, crying, actively vomiting.  Appears to be in a moderate amount of pain. Eyes: PERRL, EOMI, conjunctiva normal bilaterally.  Positive photophobia. HENT: Normocephalic, atraumatic,mucus membranes moist, normal dentition.  TM normal b/l.  Positive left-sided TMJ tenderness, crepitus bilaterally.  No tenderness over the right TMJ. No nasal congestion, no  sinus tenderness. No temporal artery tenderness.  Neck: no cervical LN + right-sided trapezial muscle tenderness. No meningismus Respiratory: normal inspiratory effort Cardiovascular: Normal rate GI:  nondistended skin: No rash, skin intact Musculoskeletal: No edema, no tenderness, no deformities Neurologic: Alert & oriented x 3, CN II-XII intact, romberg neg, finger-> nose, heel-> shin equal b/l, Romberg neg, tandem gait steady Psychiatric: Speech and behavior appropriate   ED Course  Medications  ketorolac (TORADOL) 30 MG/ML injection 30  mg (30 mg Intramuscular Given 09/18/18 1947)  dexamethasone (DECADRON) injection 10 mg (10 mg Intramuscular Given 09/18/18 1947)  SUMAtriptan (IMITREX) injection 6 mg (6 mg Subcutaneous Given 09/18/18 1948)  metoCLOPramide (REGLAN) injection 10 mg (10 mg Intramuscular Given 09/18/18 1947)  diphenhydrAMINE (BENADRYL) injection 25 mg (25 mg Intramuscular Given 09/18/18 1947)    No orders of the defined types were placed in this encounter.  No results found for this or any previous visit (from the past 24 hour(s)). No results found.   ED Clinical Impression  Migraine without aura and without status migrainosus, not intractable  ED Assessment/Plan  Pt describing typical pain, no sudden onset. Doubt SAH, ICH or space occupying lesion. Pt without fevers/chills, Pt has no meningeal sx, no nuchal rigidity. Doubt meningitis. Pt with normal neuro exam, no evidence of CVA/TIA.  Pt BP not elevated significantly, doubt hypertensive emergency. No evidence of temporal artery tenderness, no evidence of glaucoma or other ocular pathology. Will give headache cocktail ( dexamethasone 10 IM, Imitrex 6 mg ,  Reglan 10 IM  toradol 30 IM, benadryl 25 IM), and reassess.  [place] controlled substances registry for this patient consulted and feel the risk/benefit ratio today is favorable for proceeding with this prescription for a controlled substance.  Last opiate prescription was tramadol in October 2019.  Pt much improved after medications. Pt with continued non-focal neuro exam.  No neck stiffness.  Trapezial tenderness improved.  Will d/c home with nsaid, triptan, fioricet, Zofran 8 mg ODT, and have pt F/U with PCP. Discussed MDM, plan for follow up, signs and sx that should prompt return to ER. Pt and parent agree with plan  Meds ordered this encounter  Medications  . ketorolac (TORADOL) 30 MG/ML injection 30 mg  . dexamethasone (DECADRON) injection 10 mg  . SUMAtriptan (IMITREX) injection 6 mg  .  metoCLOPramide (REGLAN) injection 10 mg  . DISCONTD: diphenhydrAMINE (BENADRYL) capsule 50 mg  . diphenhydrAMINE (BENADRYL) injection 25 mg  . ibuprofen (ADVIL,MOTRIN) 600 MG tablet    Sig: Take 1 tablet (600 mg total) by mouth every 6 (six) hours as needed.    Dispense:  30 tablet    Refill:  0  . butalbital-acetaminophen-caffeine (FIORICET, ESGIC) 50-325-40 MG tablet    Sig: Take 1-2 tablets by mouth every 4 (four) hours as needed for headache. Max 6 caps/day    Dispense:  20 tablet    Refill:  0  . SUMAtriptan (IMITREX) 5 MG/ACT nasal spray    Sig: Place 1 spray (5 mg total) into the nose every 2 (two) hours as needed for migraine. Do not exceed 2 doses in 24 hours    Dispense:  2 Inhaler    Refill:  0  . ondansetron (ZOFRAN ODT) 8 MG disintegrating tablet    Sig: 1/2- 1 tablet q 8 hr prn nausea, vomiting    Dispense:  20 tablet    Refill:  0    *This clinic note was created using Scientist, clinical (histocompatibility and immunogenetics). Therefore, there may be occasional  mistakes despite careful proofreading.  ?   Domenick Gong, MD 09/18/18 2051

## 2018-09-18 NOTE — Discharge Instructions (Addendum)
Take 600 mg ibuprofen with a Tylenol containing product 3 or 4 times a day.  Either take 600 mg ibuprofen with 1 g of Tylenol or you can take the ibuprofen with 1 or 2 Fioricet.  Zofran will help with nausea vomiting, Imitrex nasal spray instead of the pills.

## 2018-09-18 NOTE — ED Triage Notes (Signed)
Pt c/o headache, pt states shes had nausea with some numbness, states this feels like her normal migraine. Pt states she takes imitrex and phenergen but threw them up.

## 2019-02-07 ENCOUNTER — Other Ambulatory Visit (INDEPENDENT_AMBULATORY_CARE_PROVIDER_SITE_OTHER): Payer: Self-pay | Admitting: Internal Medicine

## 2019-02-07 DIAGNOSIS — G43001 Migraine without aura, not intractable, with status migrainosus: Secondary | ICD-10-CM

## 2019-02-07 MED ORDER — IBUPROFEN 600 MG PO TABS: 600.0000 mg | ORAL_TABLET | Freq: Four times a day (QID) | ORAL | 0 refills | Status: DC | PRN

## 2019-02-07 NOTE — Progress Notes (Signed)
ibuo

## 2019-03-11 ENCOUNTER — Other Ambulatory Visit: Payer: Self-pay

## 2019-03-11 DIAGNOSIS — F3110 Bipolar disorder, current episode manic without psychotic features, unspecified: Secondary | ICD-10-CM | POA: Diagnosis not present

## 2019-03-11 DIAGNOSIS — R6889 Other general symptoms and signs: Secondary | ICD-10-CM | POA: Diagnosis not present

## 2019-03-11 DIAGNOSIS — Z20822 Contact with and (suspected) exposure to covid-19: Secondary | ICD-10-CM

## 2019-03-11 DIAGNOSIS — Z0389 Encounter for observation for other suspected diseases and conditions ruled out: Secondary | ICD-10-CM | POA: Diagnosis not present

## 2019-03-13 LAB — NOVEL CORONAVIRUS, NAA: SARS-CoV-2, NAA: NOT DETECTED

## 2019-05-28 ENCOUNTER — Other Ambulatory Visit: Payer: Self-pay

## 2019-05-28 DIAGNOSIS — Z20822 Contact with and (suspected) exposure to covid-19: Secondary | ICD-10-CM

## 2019-05-30 LAB — NOVEL CORONAVIRUS, NAA: SARS-CoV-2, NAA: NOT DETECTED

## 2019-07-17 DIAGNOSIS — B373 Candidiasis of vulva and vagina: Secondary | ICD-10-CM | POA: Diagnosis not present

## 2019-07-17 DIAGNOSIS — N760 Acute vaginitis: Secondary | ICD-10-CM | POA: Diagnosis not present

## 2019-07-17 DIAGNOSIS — R102 Pelvic and perineal pain: Secondary | ICD-10-CM | POA: Diagnosis not present

## 2019-07-25 DIAGNOSIS — L700 Acne vulgaris: Secondary | ICD-10-CM | POA: Diagnosis not present

## 2019-08-12 DIAGNOSIS — Z111 Encounter for screening for respiratory tuberculosis: Secondary | ICD-10-CM | POA: Diagnosis not present

## 2019-08-12 DIAGNOSIS — L700 Acne vulgaris: Secondary | ICD-10-CM | POA: Diagnosis not present

## 2019-08-12 DIAGNOSIS — T364X5A Adverse effect of tetracyclines, initial encounter: Secondary | ICD-10-CM | POA: Diagnosis not present

## 2019-08-12 DIAGNOSIS — Z23 Encounter for immunization: Secondary | ICD-10-CM | POA: Diagnosis not present

## 2019-08-20 DIAGNOSIS — Z01419 Encounter for gynecological examination (general) (routine) without abnormal findings: Secondary | ICD-10-CM | POA: Diagnosis not present

## 2019-08-20 DIAGNOSIS — N760 Acute vaginitis: Secondary | ICD-10-CM | POA: Diagnosis not present

## 2019-08-20 DIAGNOSIS — Z113 Encounter for screening for infections with a predominantly sexual mode of transmission: Secondary | ICD-10-CM | POA: Diagnosis not present

## 2019-08-20 DIAGNOSIS — B373 Candidiasis of vulva and vagina: Secondary | ICD-10-CM | POA: Diagnosis not present

## 2019-08-20 DIAGNOSIS — Z124 Encounter for screening for malignant neoplasm of cervix: Secondary | ICD-10-CM | POA: Diagnosis not present

## 2019-08-26 LAB — HM PAP SMEAR: Chlamydia, Swab/Urine, PCR: NEGATIVE

## 2019-08-30 DIAGNOSIS — Z23 Encounter for immunization: Secondary | ICD-10-CM | POA: Diagnosis not present

## 2019-09-06 DIAGNOSIS — N941 Unspecified dyspareunia: Secondary | ICD-10-CM | POA: Diagnosis not present

## 2019-09-06 DIAGNOSIS — Z30431 Encounter for routine checking of intrauterine contraceptive device: Secondary | ICD-10-CM | POA: Diagnosis not present

## 2019-09-06 DIAGNOSIS — N644 Mastodynia: Secondary | ICD-10-CM | POA: Diagnosis not present

## 2019-09-11 ENCOUNTER — Other Ambulatory Visit: Payer: Self-pay | Admitting: Obstetrics and Gynecology

## 2019-09-11 DIAGNOSIS — N644 Mastodynia: Secondary | ICD-10-CM

## 2019-09-27 ENCOUNTER — Other Ambulatory Visit: Payer: Self-pay

## 2019-09-27 ENCOUNTER — Ambulatory Visit
Admission: RE | Admit: 2019-09-27 | Discharge: 2019-09-27 | Disposition: A | Discharge status: Home / Self Care | Payer: 59 | Source: Ambulatory Visit | Attending: Obstetrics and Gynecology | Admitting: Obstetrics and Gynecology

## 2019-09-27 DIAGNOSIS — N644 Mastodynia: Secondary | ICD-10-CM

## 2019-11-20 ENCOUNTER — Other Ambulatory Visit: Payer: Self-pay

## 2019-11-20 ENCOUNTER — Ambulatory Visit: Payer: 59 | Admitting: Family Medicine

## 2019-11-20 ENCOUNTER — Encounter: Payer: Self-pay | Admitting: Family Medicine

## 2019-11-20 VITALS — BP 112/82 | HR 97 | Temp 97.7°F | Resp 15 | Ht 70.75 in | Wt 190.1 lb

## 2019-11-20 DIAGNOSIS — F419 Anxiety disorder, unspecified: Secondary | ICD-10-CM | POA: Diagnosis not present

## 2019-11-20 DIAGNOSIS — F331 Major depressive disorder, recurrent, moderate: Secondary | ICD-10-CM | POA: Diagnosis not present

## 2019-11-20 DIAGNOSIS — R5383 Other fatigue: Secondary | ICD-10-CM

## 2019-11-20 NOTE — Patient Instructions (Signed)
I appreciate the opportunity to provide you with care for your health and wellness. Today we discussed: establish care  Follow up: 2 weeks by phone; 4 weeks in office   Labs today No referrals today  Great to meet you today.   Once we have labs we can decided what next steps you would like to take. Ask mom about what medication you were on.  Continue to focus on self care :) Yoga, walking, reading, anything you enjoy. Be physically active daily for at least 30 mins.  Please continue to practice social distancing to keep you, your family, and our community safe.  If you must go out, please wear a mask and practice good handwashing.  It was a pleasure to see you and I look forward to continuing to work together on your health and well-being. Please do not hesitate to call the office if you need care or have questions about your care.  Have a wonderful day and week. With Gratitude, Tereasa Coop, DNP, AGNP-BC

## 2019-11-20 NOTE — Progress Notes (Signed)
Subjective:  Patient ID: Kim [name], female    DOB: 1996/04/19  Age: 24 y.o. MRN: [id number]  CC:  Chief Complaint  Patient presents with  . Establish Care      HPI  HPI  Kim [name] is a 24 year old female patient who presents today to establish care.  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  Has a history of ADHD, anxiety, depression, migraines.  Reports that her father had a history of bipolar disorder but was not appropriately diagnosed in time, he passed away when she was younger of lung cancer. Reports that she is been on Celexa now for some time and it usually helps her but overall she just feels like she is sad.  She does not recall the medications that she has previously been treated for things for but she knows that one of them gave her chest pain.  She reports that she thinks that she has a lot of ups and downs.  Reports that she can go days without sleeping.  And is really concerned and wants to make sure that she is getting the care that she needs.  Today patient denies signs and symptoms of COVID 19 infection including fever, chills, cough, shortness of breath, and headache. Past Medical, Surgical, Social History, Allergies, and Medications have been Reviewed.   Past Medical History:  Diagnosis Date  . ADHD (attention deficit hyperactivity disorder)   . Anxiety   . Depression   . Encounter for gynecological examination with Papanicolaou smear of cervix 03/27/2017  . Encounter for surveillance of contraceptive pills 03/27/2017  . Migraines     Current Meds  Medication Sig  . acetaminophen (TYLENOL) 500 MG tablet Take 500 mg by mouth every 6 (six) hours as needed.  . butalbital-acetaminophen-caffeine (FIORICET, ESGIC) 50-325-40 MG tablet Take 1-2 tablets by mouth every 4 (four) hours as needed for headache. Max 6 caps/day  . citalopram (CELEXA) 40 MG tablet Take 40 mg by mouth daily.  Marland Kitchen ibuprofen  (ADVIL) 600 MG tablet Take 1 tablet (600 mg total) by mouth every 6 (six) hours as needed.  . ondansetron (ZOFRAN ODT) 8 MG disintegrating tablet 1/2- 1 tablet q 8 hr prn nausea, vomiting  . [DISCONTINUED] citalopram (CELEXA) 20 MG tablet Take 1 1/2 tablets daily    ROS:  Review of Systems  Constitutional: Negative.   HENT: Negative.   Eyes: Negative.   Respiratory: Negative.   Cardiovascular: Negative.   Gastrointestinal: Negative.   Genitourinary: Negative.   Musculoskeletal: Negative.   Skin: Negative.   Neurological: Negative.   Endo/Heme/Allergies: Negative.   Psychiatric/Behavioral: Positive for depression. The patient is nervous/anxious.   All other systems reviewed and are negative.    Objective:   Today's Vitals: BP 112/82   Pulse 97   Temp 97.7 F (36.5 C) (Temporal)   Resp 15   Ht 5' 10.75" (1.797 m)   Wt 190 lb 1.9 oz (86.2 kg)   SpO2 97%   BMI 26.70 kg/m  Vitals with BMI 11/20/2019 09/18/2018 04/28/2018  Height 5' 10.75" - -  Weight 190 lbs 2 oz - -  BMI 37.85 - -  Systolic 885 027 741  Diastolic 82 75 70  Pulse 97 96 89     Physical Exam Vitals and nursing note reviewed.  Constitutional:      Appearance: Normal appearance. She is well-developed, well-groomed and overweight.  HENT:     Head: Normocephalic and atraumatic.  Right Ear: External ear normal.     Left Ear: External ear normal.     Mouth/Throat:     Comments: Mask in place Eyes:     General:        Right eye: No discharge.        Left eye: No discharge.     Conjunctiva/sclera: Conjunctivae normal.  Cardiovascular:     Rate and Rhythm: Normal rate and regular rhythm.     Pulses: Normal pulses.     Heart sounds: Normal heart sounds.  Pulmonary:     Effort: Pulmonary effort is normal.     Breath sounds: Normal breath sounds.  Musculoskeletal:        General: Normal range of motion.     Cervical back: Normal range of motion and neck supple.  Skin:    General: Skin is warm.    Neurological:     General: No focal deficit present.     Mental Status: She is alert and oriented to person, place, and time.  Psychiatric:        Attention and Perception: Attention normal.        Mood and Affect: Mood normal. Affect is tearful.        Speech: Speech normal.        Behavior: Behavior normal. Behavior is cooperative.        Thought Content: Thought content normal.        Cognition and Memory: Cognition normal.        Judgment: Judgment normal.     Depression screen [facility] 2/9 03/27/2017 08/02/2016 08/02/2016  Decreased Interest 2 2 2   Down, Depressed, Hopeless 3 1 1   PHQ - 2 Score 5 3 3   Altered sleeping 3 2 -  Tired, decreased energy 2 1 -  Change in appetite 2 2 -  Feeling bad or failure about yourself  3 3 -  Trouble concentrating 0 1 -  Moving slowly or fidgety/restless 1 3 -  Suicidal thoughts 1 1 -  PHQ-9 Score 17 16 -  Difficult doing work/chores Not difficult at all - -   GAD 7 : Generalized Anxiety Score 11/22/2019  Nervous, Anxious, on Edge 2  Control/stop worrying 2  Worry too much - different things 2  Trouble relaxing 2  Restless 1  Easily annoyed or irritable 1  Afraid - awful might happen 2  Total GAD 7 Score 12  Anxiety Difficulty Somewhat difficult     Assessment   1. Moderate episode of recurrent major depressive disorder (HCC)   2. Anxiety   3. Fatigue, unspecified type     Tests ordered Orders Placed This Encounter  Procedures  . TSH  . VITAMIN D 25 Hydroxy (Vit-D Deficiency, Fractures)  . CBC  . COMPLETE METABOLIC PANEL WITH GFR     Plan: Please see assessment and plan per problem list above.   No orders of the defined types were placed in this encounter.   Patient to follow-up in 2 weeks by phone and at 4 weeks in office   , NP

## 2019-11-21 LAB — COMPLETE METABOLIC PANEL WITH GFR
AG Ratio: 1.7 (calc) (ref 1.0–2.5)
ALT: 8 U/L (ref 6–29)
AST: 13 U/L (ref 10–30)
Albumin: 4.9 g/dL (ref 3.6–5.1)
Alkaline phosphatase (APISO): 62 U/L (ref 31–125)
BUN: 10 mg/dL (ref 7–25)
CO2: 28 mmol/L (ref 20–32)
Calcium: 10 mg/dL (ref 8.6–10.2)
Chloride: 102 mmol/L (ref 98–110)
Creat: 0.69 mg/dL (ref 0.50–1.10)
GFR, Est African American: 142 mL/min/{1.73_m2} (ref >=60)
GFR, Est Non African American: 123 mL/min/{1.73_m2} (ref >=60)
Globulin: 2.9 g/dL (calc) (ref 1.9–3.7)
Glucose, Bld: 81 mg/dL (ref 65–139)
Potassium: 4.3 mmol/L (ref 3.5–5.3)
Sodium: 138 mmol/L (ref 135–146)
Total Bilirubin: 1 mg/dL (ref 0.2–1.2)
Total Protein: 7.8 g/dL (ref 6.1–8.1)

## 2019-11-21 LAB — CBC
HCT: 41 % (ref 35.0–45.0)
Hemoglobin: 14 g/dL (ref 11.7–15.5)
MCH: 30.9 pg (ref 27.0–33.0)
MCHC: 34.1 g/dL (ref 32.0–36.0)
MCV: 90.5 fL (ref 80.0–100.0)
MPV: 9.8 fL (ref 7.5–12.5)
Platelets: 251 10*3/uL (ref 140–400)
RBC: 4.53 10*6/uL (ref 3.80–5.10)
RDW: 12 % (ref 11.0–15.0)
WBC: 8.1 10*3/uL (ref 3.8–10.8)

## 2019-11-21 LAB — TSH: TSH: 1.86 mIU/L

## 2019-11-21 LAB — VITAMIN D 25 HYDROXY (VIT D DEFICIENCY, FRACTURES): Vit D, 25-Hydroxy: 20 ng/mL — ABNORMAL LOW (ref 30–100)

## 2019-11-22 ENCOUNTER — Encounter: Payer: Self-pay | Admitting: Family Medicine

## 2019-11-22 DIAGNOSIS — R5383 Other fatigue: Secondary | ICD-10-CM | POA: Insufficient documentation

## 2019-11-22 NOTE — Assessment & Plan Note (Signed)
Continue Celexa at this time.  Possible need an adjustment of medication and referral to therapy in the near future.

## 2019-11-22 NOTE — Assessment & Plan Note (Signed)
We will continue the Celexa at this time.  Will check vitamin D and thyroid level to make sure that those are not attributing to her feelings.  In addition to possible adjustment and change in medications in the near future.  Discussed with her in detail she is open to this. Does report that she has had thoughts in the past of suicide does not have any plan or intent today in the office.  Close follow-up in 2 weeks via the phone.  And 4 weeks in the office.  Advised that if we cannot find a good ground for her with certain medication changes then we will look at therapy she is open to this.

## 2019-11-22 NOTE — Assessment & Plan Note (Signed)
Bounces between having fatigue and having energy.  Will be getting vitamin D and thyroid level checked.

## 2019-11-29 DIAGNOSIS — N760 Acute vaginitis: Secondary | ICD-10-CM | POA: Diagnosis not present

## 2019-11-29 DIAGNOSIS — B373 Candidiasis of vulva and vagina: Secondary | ICD-10-CM | POA: Diagnosis not present

## 2019-12-05 ENCOUNTER — Encounter: Payer: Self-pay | Admitting: Family Medicine

## 2019-12-05 ENCOUNTER — Telehealth (INDEPENDENT_AMBULATORY_CARE_PROVIDER_SITE_OTHER): Payer: 59 | Admitting: Family Medicine

## 2019-12-05 ENCOUNTER — Other Ambulatory Visit: Payer: Self-pay

## 2019-12-05 DIAGNOSIS — F322 Major depressive disorder, single episode, severe without psychotic features: Secondary | ICD-10-CM

## 2019-12-05 DIAGNOSIS — F411 Generalized anxiety disorder: Secondary | ICD-10-CM

## 2019-12-05 DIAGNOSIS — F410 Panic disorder [episodic paroxysmal anxiety] without agoraphobia: Secondary | ICD-10-CM | POA: Diagnosis not present

## 2019-12-05 DIAGNOSIS — F332 Major depressive disorder, recurrent severe without psychotic features: Secondary | ICD-10-CM | POA: Diagnosis not present

## 2019-12-05 MED ORDER — SERTRALINE HCL 25 MG PO TABS: 25.0000 mg | ORAL_TABLET | Freq: Every day | ORAL | 2 refills | Status: DC

## 2019-12-05 MED ORDER — HYDROXYZINE HCL 25 MG PO TABS: 25.0000 mg | ORAL_TABLET | Freq: Every day | ORAL | 0 refills | Status: DC

## 2019-12-05 MED ORDER — BUSPIRONE HCL 5 MG PO TABS: 5.0000 mg | ORAL_TABLET | Freq: Three times a day (TID) | ORAL | 1 refills | Status: DC

## 2019-12-05 NOTE — Progress Notes (Signed)
Virtual Visit via Telephone Note   This visit type was conducted due to national recommendations for restrictions regarding the COVID-19 Pandemic (e.g. social distancing) in an effort to limit this patient's exposure and mitigate transmission in our community.  Due to her co-morbid illnesses, this patient is at least at moderate risk for complications without adequate follow up.  This format is felt to be most appropriate for this patient at this time.  The patient did not have access to video technology/had technical difficulties with video requiring transitioning to audio format only (telephone).  All issues noted in this document were discussed and addressed.  No physical exam could be performed with this format.    Evaluation Performed:  Follow-up visit  Date:  12/05/2019   ID:  Kim [name], DOB 03-Mar-1996, MRN [id number]  Patient Location: Home Provider Location: Office  Location of Patient: Home Location of Provider: Telehealth Consent was obtain for visit to be over via telehealth. I verified that I am speaking with the correct person using two identifiers.  PCP:  Perlie Mayo, NP   Chief Complaint: Anxiety and depression  History of Present Illness:    Kim [name] is a 24 y.o. female with anxiety, depression, ADHD, migraines.  She established with me several weeks back today we are following up on her anxiety and depression which has worsened over the last 2 weeks.  She reports that she feels that it is because of her grandmothers anniversary passing which happened on the 26th she is gotten worse in the last 2 weeks.  This is also close to her father's death passing anniversary.  At previous visit we are trying to identify if there is a medication that she could not take because she had been on one previously that made her upset and mad and angry very easily.  However her and her mother cannot remember which medication it was.  The Celexa is not helping her  like it used to when she is ready for change in open to a change.  Of note her father did have bipolar disorder but was not taking treatment as he was supposed to prior to his death.  She reports that she has had more highs, less sleeping, 2 panic attacks.  And not eating very well.  We did discuss last time that she might have bipolar disorder that has not been diagnosed.  As sometimes this can overlap symptoms of anxiety, depression, ADHD and the like when you were younger.  She is open to medication changes today and if these medication changes do not help her in the next 1 to 3 months that we will look at possibly getting a psychiatry referral to help with identification of proper medications.  She denies having any suicidal or homicidal thoughts.  But she does report that she sometimes thinks that she might be better off not being here.  The patient does not have symptoms concerning for COVID-19 infection (fever, chills, cough, or new shortness of breath).   Past Medical, Surgical, Social History, Allergies, and Medications have been Reviewed.  Past Medical History:  Diagnosis Date  . ADHD (attention deficit hyperactivity disorder)   . Anxiety   . Depression   . Encounter for gynecological examination with Papanicolaou smear of cervix 03/27/2017  . Encounter for surveillance of contraceptive pills 03/27/2017  . Migraines    No past surgical history on file.   No outpatient medications have been marked as taking for the 12/05/19  encounter (Video Visit) with Freddy Finner, NP.     Allergies:   Amoxicillin and Amoxicillin   ROS:   Please see the history of present illness.    All other systems reviewed and are negative.   Labs/Other Tests and Data Reviewed:    Recent Labs: 11/20/2019: ALT 8; BUN 10; Creat 0.69; Hemoglobin 14.0; Platelets 251; Potassium 4.3; Sodium 138; TSH 1.86   Recent Lipid Panel No results found for: CHOL, TRIG, HDL, CHOLHDL, LDLCALC, LDLDIRECT  Wt Readings  from Last 3 Encounters:  11/20/19 190 lb 1.9 oz (86.2 kg)  03/27/17 198 lb (89.8 kg)  11/07/16 206 lb (93.4 kg)     Objective:    Vital Signs:  There were no vitals taken for this visit.   VITAL SIGNS:  reviewed GEN:  Alert and oriented RESPIRATORY:  No shortness of breath noted in conversation PSYCH:  Tearful, anxious, down affect  Depression screen [facility] 2/9 12/06/2019 03/27/2017 08/02/2016 08/02/2016  Decreased Interest 3 2 2 2   Down, Depressed, Hopeless 3 3 1 1   PHQ - 2 Score 6 5 3 3   Altered sleeping 3 3 2  -  Tired, decreased energy 3 2 1  -  Change in appetite 3 2 2  -  Feeling bad or failure about yourself  3 3 3  -  Trouble concentrating 1 0 1 -  Moving slowly or fidgety/restless 1 1 3  -  Suicidal thoughts 3 1 1  -  PHQ-9 Score 23 17 16  -  Difficult doing work/chores Very difficult Not difficult at all - -    GAD 7 : Generalized Anxiety Score 12/06/2019 11/22/2019  Nervous, Anxious, on Edge 3 2  Control/stop worrying 3 2  Worry too much - different things 3 2  Trouble relaxing 0 2  Restless 1 1  Easily annoyed or irritable 3 1  Afraid - awful might happen 3 2  Total GAD 7 Score 16 12  Anxiety Difficulty Very difficult Somewhat difficult      ASSESSMENT & PLAN:    1. Severe episode of recurrent major depressive disorder, without psychotic features (HCC)  - sertraline (ZOLOFT) 25 MG tablet; Take 1 tablet (25 mg total) by mouth daily.  Dispense: 30 tablet; Refill: 2 - busPIRone (BUSPAR) 5 MG tablet; Take 1 tablet (5 mg total) by mouth 3 (three) times daily.  Dispense: 90 tablet; Refill: 1  2. GAD (generalized anxiety disorder)  - sertraline (ZOLOFT) 25 MG tablet; Take 1 tablet (25 mg total) by mouth daily.  Dispense: 30 tablet; Refill: 2 - busPIRone (BUSPAR) 5 MG tablet; Take 1 tablet (5 mg total) by mouth 3 (three) times daily.  Dispense: 90 tablet; Refill: 1 - hydrOXYzine (ATARAX/VISTARIL) 25 MG tablet; Take 1 tablet (25 mg total) by mouth at bedtime.  Dispense: 30  tablet; Refill: 0  3. Panic attacks  Time:   Today, I have spent 20 minutes with the patient with telehealth technology discussing the above problems.     Medication Adjustments/Labs and Tests Ordered: Current medicines are reviewed at length with the patient today.  Concerns regarding medicines are outlined above.   Tests Ordered: No orders of the defined types were placed in this encounter.   Medication Changes: Meds ordered this encounter  Medications  . sertraline (ZOLOFT) 25 MG tablet    Sig: Take 1 tablet (25 mg total) by mouth daily.    Dispense:  30 tablet    Refill:  2    Order Specific Question:   Supervising Provider  Answer:   SIMPSON, MARGARET E [[id number]]  . busPIRone (BUSPAR) 5 MG tablet    Sig: Take 1 tablet (5 mg total) by mouth 3 (three) times daily.    Dispense:  90 tablet    Refill:  1    Order Specific Question:   Supervising Provider    Answer:   SIMPSON, MARGARET E [[id number]]  . hydrOXYzine (ATARAX/VISTARIL) 25 MG tablet    Sig: Take 1 tablet (25 mg total) by mouth at bedtime.    Dispense:  30 tablet    Refill:  0    Order Specific Question:   Supervising Provider    Answer:   Kerri Perches [[id number]]    Disposition:  Follow up 2 weeks in 4 weeks  Freddy Finner, NP  12/05/2019 4:03 PM     [facility]

## 2019-12-05 NOTE — Patient Instructions (Signed)
I appreciate the opportunity to provide you with care for your health and wellness. Today we discussed: mood  Follow up: 2 weeks by phone or office; 4 weeks in office (both for depression and anxiety, med change follow ups)  No labs or referrals today  Med changes today. Call if you have questions.  STOP celexa  START: zoloft, buspar, atarax  NEVER HESISTATE TO CALL IF YOU ARE HAVING TROUBLE.  Please continue to practice social distancing to keep you, your family, and our community safe.  If you must go out, please wear a mask and practice good handwashing.  It was a pleasure to see you and I look forward to continuing to work together on your health and well-being. Please do not hesitate to call the office if you need care or have questions about your care.  Have a wonderful day and week. With Gratitude, Tereasa Coop, DNP, AGNP-BC    Major Depressive Disorder, Adult Major depressive disorder (MDD) is a mental health condition. MDD often makes you feel sad, hopeless, or helpless. MDD can also cause symptoms in your body. MDD can affect your:  Work.  School.  Relationships.  Other normal activities. MDD can range from mild to very bad. It may occur once (single episode MDD). It can also occur many times (recurrent MDD). The main symptoms of MDD often include:  Feeling sad, depressed, or irritable most of the time.  Loss of interest. MDD symptoms also include:  Sleeping too much or too little.  Eating too much or too little.  A change in your weight.  Feeling tired (fatigue) or having low energy.  Feeling worthless.  Feeling guilty.  Trouble making decisions.  Trouble thinking clearly.  Thoughts of suicide or harming others.  Feeling weak.  Feeling agitated.  Keeping yourself from being around other people (isolation). Follow these instructions at home: Activity  Do these things as told by your doctor: ? Go back to your normal  activities. ? Exercise regularly. ? Spend time outdoors. Alcohol  Talk with your doctor about how alcohol can affect your antidepressant medicines.  Do not drink alcohol. Or, limit how much alcohol you drink. ? This means no more than 1 drink a day for nonpregnant women and 2 drinks a day for men. One drink equals one of these:  12 oz of beer.  5 oz of wine.  1 oz of hard liquor. General instructions  Take over-the-counter and prescription medicines only as told by your doctor.  Eat a healthy diet.  Get plenty of sleep.  Find activities that you enjoy. Make time to do them.  Think about joining a support group. Your doctor may be able to suggest a group for you.  Keep all follow-up visits as told by your doctor. This is important. Where to find more information:  The [employer] on Mental Illness: ? www.nami.org  [place] [employer] [place] of Mental Health: ? http://www.maynard.net/  National Suicide Prevention Lifeline: ? 548 512 0786. This is free, 24-hour help. Contact a doctor if:  Your symptoms get worse.  You have new symptoms. Get help right away if:  You self-harm.  You see, hear, taste, smell, or feel things that are not present (hallucinate). If you ever feel like you may hurt yourself or others, or have thoughts about taking your own life, get help right away. You can go to your nearest emergency department or call:  Your local emergency services (911 in the [place]  A suicide crisis helpline, such as the [employer]  Suicide Prevention Lifeline: ? (956)663-5833. This is open 24 hours a day. This information is not intended to replace advice given to you by your health care provider. Make sure you discuss any questions you have with your health care provider. Document Revised: 06/09/2017 Document Reviewed: 03/13/2016 Elsevier Patient Education  2020 Elsevier Inc.   Managing Anxiety, Adult After being diagnosed with an anxiety disorder, you may be  relieved to know why you have felt or behaved a certain way. You may also feel overwhelmed about the treatment ahead and what it will mean for your life. With care and support, you can manage this condition and recover from it. How to manage lifestyle changes Managing stress and anxiety  Stress is your body's reaction to life changes and events, both good and bad. Most stress will last just a few hours, but stress can be ongoing and can lead to more than just stress. Although stress can play a major role in anxiety, it is not the same as anxiety. Stress is usually caused by something external, such as a deadline, test, or competition. Stress normally passes after the triggering event has ended.  Anxiety is caused by something internal, such as imagining a terrible outcome or worrying that something will go wrong that will devastate you. Anxiety often does not go away even after the triggering event is over, and it can become long-term (chronic) worry. It is important to understand the differences between stress and anxiety and to manage your stress effectively so that it does not lead to an anxious response. Talk with your health care provider or a counselor to learn more about reducing anxiety and stress. He or she may suggest tension reduction techniques, such as:  Music therapy. This can include creating or listening to music that you enjoy and that inspires you.  Mindfulness-based meditation. This involves being aware of your normal breaths while not trying to control your breathing. It can be done while sitting or walking.  Centering prayer. This involves focusing on a word, phrase, or sacred image that means something to you and brings you peace.  Deep breathing. To do this, expand your stomach and inhale slowly through your nose. Hold your breath for 3-5 seconds. Then exhale slowly, letting your stomach muscles relax.  Self-talk. This involves identifying thought patterns that lead to anxiety  reactions and changing those patterns.  Muscle relaxation. This involves tensing muscles and then relaxing them. Choose a tension reduction technique that suits your lifestyle and personality. These techniques take time and practice. Set aside 5-15 minutes a day to do them. Therapists can offer counseling and training in these techniques. The training to help with anxiety may be covered by some insurance plans. Other things you can do to manage stress and anxiety include:  Keeping a stress/anxiety diary. This can help you learn what triggers your reaction and then learn ways to manage your response.  Thinking about how you react to certain situations. You may not be able to control everything, but you can control your response.  Making time for activities that help you relax and not feeling guilty about spending your time in this way.  Visual imagery and yoga can help you stay calm and relax.  Medicines Medicines can help ease symptoms. Medicines for anxiety include:  Anti-anxiety drugs.  Antidepressants. Medicines are often used as a primary treatment for anxiety disorder. Medicines will be prescribed by a health care provider. When used together, medicines, psychotherapy, and tension reduction techniques may be  the most effective treatment. Relationships Relationships can play a big part in helping you recover. Try to spend more time connecting with trusted friends and family members. Consider going to couples counseling, taking family education classes, or going to family therapy. Therapy can help you and others better understand your condition. How to recognize changes in your anxiety Everyone responds differently to treatment for anxiety. Recovery from anxiety happens when symptoms decrease and stop interfering with your daily activities at home or work. This may mean that you will start to:  Have better concentration and focus. Worry will interfere less in your daily thinking.  Sleep  better.  Be less irritable.  Have more energy.  Have improved memory. It is important to recognize when your condition is getting worse. Contact your health care provider if your symptoms interfere with home or work and you feel like your condition is not improving. Follow these instructions at home: Activity  Exercise. Most adults should do the following: ? Exercise for at least 150 minutes each week. The exercise should increase your heart rate and make you sweat (moderate-intensity exercise). ? Strengthening exercises at least twice a week.  Get the right amount and quality of sleep. Most adults need 7-9 hours of sleep each night. Lifestyle   Eat a healthy diet that includes plenty of vegetables, fruits, whole grains, low-fat dairy products, and lean protein. Do not eat a lot of foods that are high in solid fats, added sugars, or salt.  Make choices that simplify your life.  Do not use any products that contain nicotine or tobacco, such as cigarettes, e-cigarettes, and chewing tobacco. If you need help quitting, ask your health care provider.  Avoid caffeine, alcohol, and certain over-the-counter cold medicines. These may make you feel worse. Ask your pharmacist which medicines to avoid. General instructions  Take over-the-counter and prescription medicines only as told by your health care provider.  Keep all follow-up visits as told by your health care provider. This is important. Where to find support You can get help and support from these sources:  Self-help groups.  Online and [employer].  A trusted spiritual leader.  Couples counseling.  Family education classes.  Family therapy. Where to find more information You may find that joining a support group helps you deal with your anxiety. The following sources can help you locate counselors or support groups near you:  Mental Health America: www.mentalhealthamerica.net  Anxiety and Depression  Association of [place] (ADAA): ProgramCam.de  The [employer] on Mental Illness (NAMI): www.nami.org Contact a health care provider if you:  Have a hard time staying focused or finishing daily tasks.  Spend many hours a day feeling worried about everyday life.  Become exhausted by worry.  Start to have headaches, feel tense, or have nausea.  Urinate more than normal.  Have diarrhea. Get help right away if you have:  A racing heart and shortness of breath.  Thoughts of hurting yourself or others. If you ever feel like you may hurt yourself or others, or have thoughts about taking your own life, get help right away. You can go to your nearest emergency department or call:  Your local emergency services (911 in the [place]  A suicide crisis helpline, such as the National Suicide Prevention Lifeline at (480) 325-3535. This is open 24 hours a day. Summary  Taking steps to learn and use tension reduction techniques can help calm you and help prevent triggering an anxiety reaction.  When used together, medicines, psychotherapy, and tension  reduction techniques may be the most effective treatment.  Family, friends, and partners can play a big part in helping you recover from an anxiety disorder. This information is not intended to replace advice given to you by your health care provider. Make sure you discuss any questions you have with your health care provider. Document Revised: 11/27/2018 Document Reviewed: 11/27/2018 Elsevier Patient Education  [place].

## 2019-12-06 ENCOUNTER — Encounter: Payer: Self-pay | Admitting: Family Medicine

## 2019-12-06 DIAGNOSIS — F320 Major depressive disorder, single episode, mild: Secondary | ICD-10-CM | POA: Insufficient documentation

## 2019-12-06 NOTE — Assessment & Plan Note (Addendum)
PHQ is increased along with GAD.  Denies having any SI or HI.  I am very concerned that she has not been appropriately treated in the past.  She is not having any psychotic features.  However she is having more manic-like episodes which include limited eating and sleeping.  Hard to know if her triggers are surrounded by her grandmother and her dad's death.  I will try changing her medication from Celexa to Zoloft and starting BuSpar.  And using Atarax at night to help with sleep.  I do not want to overmedicate her start too many medications at one time secondary to possible having side effects.  However I am very worried and would like to make sure that she is getting the treatment she needs.  I will do close follow-up in 2 weeks and at 4 weeks and if she is not starting to feel any better we can do titration and referral to psychology as she might also have a mixed form of major depressive disorder and/or bipolar disorder and I want her to receive the best treatment possible.  She is in agreement with this care plan and is understanding of the side effects of the medications that are provided today.  I have advised for her to follow-up if needed and not to wait if she has any issues or concerns or feels like harming herself.  She verbalized understanding of this and entered a contract to not harm herself.

## 2019-12-06 NOTE — Assessment & Plan Note (Signed)
BuSpar daily, close follow-up please review anxiety and depression plan of cares

## 2019-12-06 NOTE — Assessment & Plan Note (Signed)
Discontinuing Celexa weaning to Zoloft, use of BuSpar, Atarax at night.  Close follow-up in 2 weeks to make sure that she is doing okay.  Education on when to reach out if she were to have any feelings of wanting to hurt or harm herself.  She denies having any SI or HI today during the phone visit.  However I still remain very worried and cautious of her as in the last 2 weeks her symptoms have increased.

## 2019-12-27 ENCOUNTER — Telehealth: Payer: 59 | Admitting: Family Medicine

## 2020-01-02 ENCOUNTER — Encounter: Payer: Self-pay | Admitting: Family Medicine

## 2020-01-02 ENCOUNTER — Other Ambulatory Visit: Payer: Self-pay

## 2020-01-02 ENCOUNTER — Telehealth (INDEPENDENT_AMBULATORY_CARE_PROVIDER_SITE_OTHER): Payer: 59 | Admitting: Family Medicine

## 2020-01-02 VITALS — BP 112/82 | Ht 70.0 in | Wt 190.0 lb

## 2020-01-02 DIAGNOSIS — F419 Anxiety disorder, unspecified: Secondary | ICD-10-CM

## 2020-01-02 DIAGNOSIS — F410 Panic disorder [episodic paroxysmal anxiety] without agoraphobia: Secondary | ICD-10-CM | POA: Diagnosis not present

## 2020-01-02 DIAGNOSIS — F320 Major depressive disorder, single episode, mild: Secondary | ICD-10-CM | POA: Diagnosis not present

## 2020-01-02 NOTE — Assessment & Plan Note (Addendum)
She is doing much better on the Zoloft, BuSpar once daily, Atarax at night.  PHQ has greatly reduced in addition GAD having some reduction  Denies having any panic attacks reports sleeping well denies having any SI or HI.  Will follow-up in 5 to 6 months on how she is doing.  Advised her to continue medication as directed if she has any issues she knows she can reach out for him.  We will continue current doses. Patient acknowledged agreement and understanding of the plan.

## 2020-01-02 NOTE — Progress Notes (Signed)
Virtual Visit via Telephone Note   This visit type was conducted due to national recommendations for restrictions regarding the COVID-19 Pandemic (e.g. social distancing) in an effort to limit this patient's exposure and mitigate transmission in our community.  Due to her co-morbid illnesses, this patient is at least at moderate risk for complications without adequate follow up.  This format is felt to be most appropriate for this patient at this time.  The patient did not have access to video technology/had technical difficulties with video requiring transitioning to audio format only (telephone).  All issues noted in this document were discussed and addressed.  No physical exam could be performed with this format. Evaluation Performed:  Follow-up visit  Date:  01/02/2020   ID:  Kim [name], DOB May 24, 1996, MRN [id number]  Patient Location: Home Provider Location: Office  Location of Patient: Home Location of Provider: Telehealth Consent was obtain for visit to be over via telehealth. I verified that I am speaking with the correct person using two identifiers.  PCP:  Perlie Mayo, NP   Chief Complaint:  Mood   History of Present Illness:    Kim [name] is a 24 y.o. female with history of ADHD, anxiety, depression, panic attacks.  Reports today for follow-up on medication changes where we switched her from Celexa to Zoloft.  She is taking BuSpar once daily.  Taking Atarax at night.  She reports she is feeling much better and overall doing great and would like to continue the current medications where she is at.  She has a great reduction in her depression.  Her anxiety is much better controlled at this time.  Though she still experiences it it is not debilitating.  The patient does not have symptoms concerning for COVID-19 infection (fever, chills, cough, or new shortness of breath).   Past Medical, Surgical, Social History, Allergies, and Medications have been  Reviewed.  Past Medical History:  Diagnosis Date  . ADHD (attention deficit hyperactivity disorder)   . Anxiety   . Depression   . Encounter for gynecological examination with Papanicolaou smear of cervix 03/27/2017  . Encounter for surveillance of contraceptive pills 03/27/2017  . Migraines    History reviewed. No pertinent surgical history.   Current Meds  Medication Sig  . acetaminophen (TYLENOL) 500 MG tablet Take 500 mg by mouth every 6 (six) hours as needed.  . busPIRone (BUSPAR) 5 MG tablet Take 1 tablet (5 mg total) by mouth 3 (three) times daily.  . butalbital-acetaminophen-caffeine (FIORICET, ESGIC) 50-325-40 MG tablet Take 1-2 tablets by mouth every 4 (four) hours as needed for headache. Max 6 caps/day  . hydrOXYzine (ATARAX/VISTARIL) 25 MG tablet Take 1 tablet (25 mg total) by mouth at bedtime.  . sertraline (ZOLOFT) 25 MG tablet Take 1 tablet (25 mg total) by mouth daily.  Marland Kitchen UNABLE TO FIND daily. Vit B,C,D3,Zinc     Allergies:   Amoxicillin and Amoxicillin   ROS:   Please see the history of present illness.    All other systems reviewed and are negative.   Labs/Other Tests and Data Reviewed:    Recent Labs: 11/20/2019: ALT 8; BUN 10; Creat 0.69; Hemoglobin 14.0; Platelets 251; Potassium 4.3; Sodium 138; TSH 1.86   Recent Lipid Panel No results found for: CHOL, TRIG, HDL, CHOLHDL, LDLCALC, LDLDIRECT  Wt Readings from Last 3 Encounters:  01/02/20 190 lb (86.2 kg)  11/20/19 190 lb 1.9 oz (86.2 kg)  03/27/17 198 lb (89.8 kg)  Objective:    Vital Signs:  BP 112/82   Ht 5\' 10"  (1.778 m)   Wt 190 lb (86.2 kg)   BMI 27.26 kg/m    VITAL SIGNS:  reviewed GEN:  alert and oriented RESPIRATORY:  no shortness of breath in conversation  PSYCH:  normal affect and mood    Depression screen [facility] 2/9 01/02/2020 12/06/2019 03/27/2017 08/02/2016 08/02/2016  Decreased Interest 0 3 2 2 2   Down, Depressed, Hopeless 1 3 3 1 1   PHQ - 2 Score 1 6 5 3 3   Altered sleeping 0 3  3 2  -  Tired, decreased energy 1 3 2 1  -  Change in appetite 0 3 2 2  -  Feeling bad or failure about yourself  0 3 3 3  -  Trouble concentrating 0 1 0 1 -  Moving slowly or fidgety/restless 0 1 1 3  -  Suicidal thoughts 0 3 1 1  -  PHQ-9 Score 2 23 17 16  -  Difficult doing work/chores Somewhat difficult Very difficult Not difficult at all - -    GAD 7 : Generalized Anxiety Score 01/02/2020 12/06/2019 11/22/2019  Nervous, Anxious, on Edge 1 3 2   Control/stop worrying 1 3 2   Worry too much - different things 3 3 2   Trouble relaxing 2 0 2  Restless 0 1 1  Easily annoyed or irritable 3 3 1   Afraid - awful might happen 3 3 2   Total GAD 7 Score 13 16 12   Anxiety Difficulty Somewhat difficult Very difficult Somewhat difficult     ASSESSMENT & PLAN:     1. MDD (major depressive disorder), single episode, mild (HCC)  2. Anxiety  3. Panic attacks  Timee:   Today, I have spent 10 minutes with the patient with telehealth technology discussing the above problems.     Medication Adjustments/Labs and Tests Ordered: Current medicines are reviewed at length with the patient today.  Concerns regarding medicines are outlined above.   Tests Ordered: No orders of the defined types were placed in this encounter.   Medication Changes: No orders of the defined types were placed in this encounter.   Disposition:  Follow up 5-6 months Signed, , NP  01/02/2020 3:22 PM     [facility]

## 2020-01-02 NOTE — Assessment & Plan Note (Signed)
Much improved with the use of combination medication of BuSpar, Zoloft, Atarax at night.

## 2020-01-02 NOTE — Patient Instructions (Signed)
I appreciate the opportunity to provide you with care for your health and wellness. Today we discussed: mood  Follow up: 5-6 months for mood can be by phone if desired  No labs or referrals today  So happy you are feeling better! Continue the meds as discussed. Call if you need anything before your next appt.  Please continue to practice social distancing to keep you, your family, and our community safe.  If you must go out, please wear a mask and practice good handwashing.  It was a pleasure to see you and I look forward to continuing to work together on your health and well-being. Please do not hesitate to call the office if you need care or have questions about your care.  Have a wonderful day and week. With Gratitude, Tereasa Coop, DNP, AGNP-BC

## 2020-01-02 NOTE — Assessment & Plan Note (Signed)
She is doing much better on the Zoloft, BuSpar once daily, Atarax at night.  PHQ has greatly reduced, 23 to 2 in addition to GAD is 13 .  Denies having any panic attacks reports sleeping well denies having any SI or HI.  Will follow-up in 5 to 6 months on how she is doing.  Advised her to continue medication as directed if she has any issues she knows she can reach out for him.  We will continue current doses. Patient acknowledged agreement and understanding of the plan.

## 2020-01-10 ENCOUNTER — Encounter: Payer: Self-pay | Admitting: Emergency Medicine

## 2020-01-10 ENCOUNTER — Ambulatory Visit
Admission: EM | Admit: 2020-01-10 | Discharge: 2020-01-10 | Disposition: A | Discharge status: Home / Self Care | Payer: 59 | Attending: Emergency Medicine | Admitting: Emergency Medicine

## 2020-01-10 ENCOUNTER — Ambulatory Visit (INDEPENDENT_AMBULATORY_CARE_PROVIDER_SITE_OTHER): Payer: 59

## 2020-01-10 ENCOUNTER — Other Ambulatory Visit: Payer: Self-pay

## 2020-01-10 DIAGNOSIS — S99922A Unspecified injury of left foot, initial encounter: Secondary | ICD-10-CM | POA: Diagnosis not present

## 2020-01-10 DIAGNOSIS — T148XXA Other injury of unspecified body region, initial encounter: Secondary | ICD-10-CM

## 2020-01-10 DIAGNOSIS — M79672 Pain in left foot: Secondary | ICD-10-CM | POA: Diagnosis not present

## 2020-01-10 MED ORDER — IBUPROFEN 800 MG PO TABS
800.0000 mg | ORAL_TABLET | Freq: Three times a day (TID) | ORAL | 0 refills | Status: DC
Start: 2020-01-10 — End: 2020-06-24

## 2020-01-10 NOTE — Discharge Instructions (Addendum)
Take ibuprofen as prescribed for pain Follow-up with orthopedic Follow RICE instruction that is attached Return or go to ED for worsening of symptoms

## 2020-01-10 NOTE — ED Triage Notes (Signed)
Pain and swelling to LT toes and foot that started yesterday.  Pt had a fx in this area before. Denies any injury but was helping her sister move yesterday.

## 2020-01-10 NOTE — ED Provider Notes (Signed)
[facility]   [id number] 01/10/20 Arrival Time: 1101   Chief Complaint  Patient presents with   Foot Pain     SUBJECTIVE: History from: patient.  Kim [name] is a 24 y.o. female who presented to the urgent care for complaint of left second toe swelling and pain that started yesterday.  She states she had helped her sister move yesterday.  Denies recent trauma or injury.  Reports she she broke the left second toe couple years ago that self resolved.  Has tried OTC ibuprofen with mild relief.  Symptoms are made worse with ROM.     Denies fever, chills, nausea, vomiting, diarrhea, chest pain, shortness of breath.   ROS: As per HPI.  All other pertinent ROS negative.     Past Medical History:  Diagnosis Date   ADHD (attention deficit hyperactivity disorder)    Anxiety    Depression    Encounter for gynecological examination with Papanicolaou smear of cervix 03/27/2017   Encounter for surveillance of contraceptive pills 03/27/2017   Migraines    History reviewed. No pertinent surgical history. Allergies  Allergen Reactions   Amoxicillin Rash   Penicillins Rash   Amoxicillin    Doxycycline Hives   Dicyclomine Rash   Latex Itching   No current facility-administered medications on file prior to encounter.   Current Outpatient Medications on File Prior to Encounter  Medication Sig Dispense Refill   acetaminophen (TYLENOL) 500 MG tablet Take 500 mg by mouth every 6 (six) hours as needed.     busPIRone (BUSPAR) 5 MG tablet Take 1 tablet (5 mg total) by mouth 3 (three) times daily. 90 tablet 1   butalbital-acetaminophen-caffeine (FIORICET, ESGIC) 50-325-40 MG tablet Take 1-2 tablets by mouth every 4 (four) hours as needed for headache. Max 6 caps/day 20 tablet 0   hydrOXYzine (ATARAX/VISTARIL) 25 MG tablet Take 1 tablet (25 mg total) by mouth at bedtime. 30 tablet 0   sertraline (ZOLOFT) 25 MG tablet Take 1 tablet (25 mg total) by mouth daily. 30  tablet 2   UNABLE TO FIND daily. Vit B,C,D3,Zinc     Social History   Socioeconomic History   Marital status: Single    Spouse name: Not on file   Number of children: Not on file   Years of education: Not on file   Highest education level: Associate degree: occupational, Scientist, product/process development, or vocational program  Occupational History   Not on file  Tobacco Use   Smoking status: Never Smoker   Smokeless tobacco: Never Used  Vaping Use   Vaping Use: Every day  Substance and Sexual Activity   Alcohol use: Yes    Comment: monthly   Drug use: No   Sexual activity: Yes    Birth control/protection: I.U.D.  Other Topics Concern   Not on file  Social History Narrative   Lives with mom, step dad      Pets: 3 dogs: Skimp, Ginger, and Evie May      Enjoys: read-genre: poetry, movies and music      Diet: eats all food groups, snacks with stress   Caffeine: coffee-2 cups; tea daily, mountain dew    Water: 1-2 cups daily       Does not wear seat belt due to being anxiety    Does not use phone while driving   Psychologist, sport and exercise at home    Social Determinants of Health   Financial Resource Strain:    Difficulty of Paying Living Expenses:   Food  Insecurity:    Worried About Programme researcher, broadcasting/film/video in the Last Year:    Barista in the Last Year:   Transportation Needs:    Freight forwarder (Medical):    Lack of Transportation (Non-Medical):   Physical Activity:    Days of Exercise per Week:    Minutes of Exercise per Session:   Stress:    Feeling of Stress :   Social Connections:    Frequency of Communication with Friends and Family:    Frequency of Social Gatherings with Friends and Family:    Attends Religious Services:    Active Member of Clubs or Organizations:    Attends Engineer, structural:    Marital Status:   Intimate Partner Violence:    Fear of Current or Ex-Partner:    Emotionally Abused:    Physically Abused:    Sexually  Abused:    Family History  Problem Relation Age of Onset   Cancer Paternal Grandfather    Alzheimer's disease Paternal Grandmother    Cancer Maternal Grandmother        breast    Cancer Maternal Grandfather        pancreatic and bone   Cancer Father        lung   Hypertension Mother     OBJECTIVE:  Vitals:   01/10/20 1119 01/10/20 1120  BP: 126/89   Pulse: (!) 112   Resp: 18   Temp: 99 F (37.2 C)   TempSrc: Oral   SpO2: 98%   Weight:  190 lb (86.2 kg)  Height:  5\' 11"  (1.803 m)     Physical Exam Vitals and nursing note reviewed.  Constitutional:      General: She is not in acute distress.    Appearance: Normal appearance. She is normal weight. She is not ill-appearing, toxic-appearing or diaphoretic.  Cardiovascular:     Rate and Rhythm: Normal rate and regular rhythm.     Pulses: Normal pulses.     Heart sounds: Normal heart sounds. No murmur heard.  No friction rub. No gallop.   Pulmonary:     Effort: Pulmonary effort is normal. No respiratory distress.     Breath sounds: Normal breath sounds. No stridor. No wheezing, rhonchi or rales.  Chest:     Chest wall: No tenderness.  Musculoskeletal:        General: Swelling and tenderness present.     Right foot: Normal.     Left foot: Swelling and tenderness present.     Comments: Patient is able to bear weight and ambulate with pain.  No surface trauma, ecchymosis, erythema, lesion, ulcer or break in skin integrity.  The left foot is with obvious deformity compared to the right foot.  Tenderness to palpation over left foot second toe.  Normal plantar dorsiflexion, inversion/eversion with pain neurovascular status intact.  Neurological:     Mental Status: She is alert and oriented to person, place, and time.     Sensory: No sensory deficit.   t  LABS:  No results found for this or any previous visit (from the past 24 hour(s)).   ASSESSMENT & PLAN:  1. Left foot pain   2. Avulsion fracture     Meds  ordered this encounter  Medications   ibuprofen (ADVIL) 800 MG tablet    Sig: Take 1 tablet (800 mg total) by mouth 3 (three) times daily.    Dispense:  21 tablet    Refill:  0  Patient is stable at discharge. Left foot X-ray is positive for small acute avulsion fracture.  I have reviewed the x-ray myself and the radiologist interpretation.  I am in agreement with the radiologist interpretation. She was advised to follow-up with orthopedic   Discharge Instructions Take ibuprofen as prescribed for pain Follow-up with orthopedic Follow RICE instruction that is attached Return or go to ED for worsening of symptoms  Reviewed expectations re: course of current medical issues. Questions answered. Outlined signs and symptoms indicating need for more acute intervention. Patient verbalized understanding. After Visit Summary given.      Note: This document was prepared using Dragon voice recognition software and may include unintentional dictation errors.    Durward Parcel, FNP 01/10/20 1158

## 2020-01-22 DIAGNOSIS — M25572 Pain in left ankle and joints of left foot: Secondary | ICD-10-CM | POA: Diagnosis not present

## 2020-01-23 DIAGNOSIS — M79672 Pain in left foot: Secondary | ICD-10-CM | POA: Diagnosis not present

## 2020-01-29 DIAGNOSIS — M79672 Pain in left foot: Secondary | ICD-10-CM | POA: Diagnosis not present

## 2020-06-03 ENCOUNTER — Ambulatory Visit: Payer: 59 | Admitting: Family Medicine

## 2020-06-05 ENCOUNTER — Other Ambulatory Visit: Payer: Self-pay | Admitting: Family Medicine

## 2020-06-05 DIAGNOSIS — F411 Generalized anxiety disorder: Secondary | ICD-10-CM

## 2020-06-05 DIAGNOSIS — F332 Major depressive disorder, recurrent severe without psychotic features: Secondary | ICD-10-CM

## 2020-06-24 ENCOUNTER — Encounter: Payer: Self-pay | Admitting: Family Medicine

## 2020-06-24 ENCOUNTER — Other Ambulatory Visit: Payer: Self-pay

## 2020-06-24 ENCOUNTER — Ambulatory Visit: Payer: 59 | Admitting: Family Medicine

## 2020-06-24 ENCOUNTER — Other Ambulatory Visit (HOSPITAL_COMMUNITY): Payer: Self-pay | Admitting: Family Medicine

## 2020-06-24 VITALS — BP 128/70 | HR 100 | Temp 98.0°F | Ht 72.0 in | Wt 184.0 lb

## 2020-06-24 DIAGNOSIS — F332 Major depressive disorder, recurrent severe without psychotic features: Secondary | ICD-10-CM

## 2020-06-24 DIAGNOSIS — F1010 Alcohol abuse, uncomplicated: Secondary | ICD-10-CM

## 2020-06-24 DIAGNOSIS — Z23 Encounter for immunization: Secondary | ICD-10-CM

## 2020-06-24 DIAGNOSIS — K30 Functional dyspepsia: Secondary | ICD-10-CM

## 2020-06-24 DIAGNOSIS — F411 Generalized anxiety disorder: Secondary | ICD-10-CM | POA: Insufficient documentation

## 2020-06-24 MED ORDER — BUSPIRONE HCL 10 MG PO TABS: 5.0000 mg | ORAL_TABLET | Freq: Three times a day (TID) | ORAL | 1 refills | Status: DC

## 2020-06-24 MED ORDER — OMEPRAZOLE 40 MG PO CPDR: 40.0000 mg | DELAYED_RELEASE_CAPSULE | Freq: Every day | ORAL | 3 refills | Status: DC

## 2020-06-24 MED ORDER — SERTRALINE HCL 50 MG PO TABS: 50.0000 mg | ORAL_TABLET | Freq: Every day | ORAL | 2 refills | Status: DC

## 2020-06-24 NOTE — Assessment & Plan Note (Deleted)
Worsening given situations that are going on.  In addition to drinking binge frequently throughout the week.  Increase BuSpar and Zoloft and therapy for psychology ordered. °

## 2020-06-24 NOTE — Patient Instructions (Signed)
  I appreciate the opportunity to provide you with care for your health and wellness. Today we discussed: several concerns   Follow up: 2 weeks in office -mood   No labs  Referrals today: Psychology  Medications adjusted and addition of prilosec daily.  You are enough You are capable You are special You are smart You are loved  Please know these and repeat them to yourself daily.  We talked about a lot of ideas to try to help with somethings you are going through. Let me know if I can ever be of help.  The decision for anything in your life is yours now.  Take it and run with it, making it the best life for you.   Remember we all have life books, but not all characters travel the entire book with [place]. And that is ok. When one chapter ends, another can be begin.  Please continue to practice social distancing to keep you, your family, and our community safe.  If you must go out, please wear a mask and practice good handwashing.  It was a pleasure to see you and I look forward to continuing to work together on your health and well-being. Please do not hesitate to call the office if you need care or have questions about your care.  Have a wonderful day. With Gratitude, Tereasa Coop, DNP, AGNP-BC

## 2020-06-24 NOTE — Assessment & Plan Note (Signed)

## 2020-06-24 NOTE — Assessment & Plan Note (Signed)
Severe depression.  Increasing Zoloft to 50 mg.  Increasing BuSpar to 10 mg.  Referral to psychology.  Extensive education on binge drinking.  Provided with information on how to avoid binge drinking.  Additionally she is reported that she does not have any suicidal thoughts at this time.  And to contact us if she were to ever feel like she was going to commit suicide or to call 911.  Close follow-up 2 weeks

## 2020-06-24 NOTE — Progress Notes (Signed)
Subjective:  Patient ID: Kim [name], female    DOB: 1995/07/22  Age: 24 y.o. MRN: [id number]  CC:  Chief Complaint  Patient presents with  . Depression    F/u      HPI  HPI   Kim [name] is a 24 year old female patient of mine who presents today secondary to having depression and anxiety.  She reports that she has had an excessive increase in her anxiety and depression since September.  When her grandmothers anniversary of her passing occurred.  She reports that she also had a separation from her fianc of 9 months a boyfriend of 4 years in October.  She has started drinking more every other night regularly she drinks 8-10 shots when she is at home sometimes 4-5 drinks when she is out.  She is open to therapy.  She is unsure about medication changes but is willing.  She denies missing work related to drinking.  She reports that she does get pretty sick with this and she is been having a lot of stomach discomfort which her mom thinks is an ulceration or some type of gastritis secondary to her increased binge drinking.  She has a history of having IBS and recently has been vomiting and having diarrhea more frequently.  She reports that she lost weight from 190 in June to 172 when all this was going on and she has now gotten back up to 184.  She reports family and friends have made comments to her about her weight.  Additionally friends have made comments to her now that she is trying to back off of going out to drink.  And her mom and dad would like her to get help whom she lives with.  Today patient denies signs and symptoms of COVID 19 infection including fever, chills, cough, shortness of breath, and headache. Past Medical, Surgical, Social History, Allergies, and Medications have been Reviewed.   Past Medical History:  Diagnosis Date  . ADHD (attention deficit hyperactivity disorder)   . Anxiety   . Depression   . Encounter for gynecological examination with  Papanicolaou smear of cervix 03/27/2017  . Encounter for surveillance of contraceptive pills 03/27/2017  . Migraines     Current Meds  Medication Sig  . acetaminophen (TYLENOL) 500 MG tablet Take 500 mg by mouth every 6 (six) hours as needed.  . butalbital-acetaminophen-caffeine (FIORICET, ESGIC) 50-325-40 MG tablet Take 1-2 tablets by mouth every 4 (four) hours as needed for headache. Max 6 caps/day  . hydrOXYzine (ATARAX/VISTARIL) 25 MG tablet Take 1 tablet (25 mg total) by mouth at bedtime.  Marland Kitchen UNABLE TO FIND daily. Vit B,C,D3,Zinc  . [DISCONTINUED] busPIRone (BUSPAR) 5 MG tablet Take 1 tablet (5 mg total) by mouth 3 (three) times daily.  . [DISCONTINUED] ibuprofen (ADVIL) 800 MG tablet Take 1 tablet (800 mg total) by mouth 3 (three) times daily.  . [DISCONTINUED] sertraline (ZOLOFT) 25 MG tablet Take 1 tablet (25 mg total) by mouth daily.    ROS:  Review of Systems  Constitutional: Negative.   HENT: Negative.   Eyes: Negative.   Respiratory: Negative.   Cardiovascular: Negative.   Gastrointestinal: Negative.   Genitourinary: Negative.   Musculoskeletal: Negative.   Skin: Negative.   Neurological: Negative.   Endo/Heme/Allergies: Negative.   Psychiatric/Behavioral: Positive for depression.     Objective:   Today's Vitals: BP 128/70 (BP Location: Right Arm, Patient Position: Sitting, Cuff Size: Normal)   Pulse 100   Temp 98 F (  36.7 C) (Temporal)   Ht 6' (1.829 m)   Wt 184 lb (83.5 kg)   LMP 05/11/2020   SpO2 99%   BMI 24.95 kg/m  Vitals with BMI 06/24/2020 01/10/2020 01/02/2020  Height 6\' 0"  5\' 11"  5\' 10"   Weight 184 lbs 190 lbs 190 lbs  BMI 24.95 26.51 27.26  Systolic 128 126  Diastolic 70 89 82  Pulse 100 112 -     Physical Exam Vitals and nursing note reviewed.  Constitutional:      Appearance: Normal appearance. She is well-developed and well-groomed. She is obese.  HENT:     Head: Normocephalic and atraumatic.     Right Ear: External ear normal.      Left Ear: External ear normal.     Nose: Nose normal.     Mouth/Throat:     Mouth: Mucous membranes are moist.     Pharynx: Oropharynx is clear.  Eyes:     General:        Right eye: No discharge.        Left eye: No discharge.     Conjunctiva/sclera: Conjunctivae normal.  Cardiovascular:     Rate and Rhythm: Normal rate and regular rhythm.     Pulses: Normal pulses.     Heart sounds: Normal heart sounds.  Pulmonary:     Effort: Pulmonary effort is normal.     Breath sounds: Normal breath sounds.  Musculoskeletal:        General: Normal range of motion.     Cervical back: Normal range of motion and neck supple.  Skin:    General: Skin is warm.  Neurological:     General: No focal deficit present.     Mental Status: She is alert and oriented to person, place, and time.  Psychiatric:        Attention and Perception: Attention normal.        Mood and Affect: Mood is depressed. Affect is flat.        Speech: Speech normal.        Behavior: Behavior is withdrawn. Behavior is cooperative.        Thought Content: Thought content normal.        Cognition and Memory: Cognition normal.        Judgment: Judgment normal.     Assessment   1. Severe episode of recurrent major depressive disorder, without psychotic features (HCC)   2. GAD (generalized anxiety disorder)   3. Indigestion   4. Alcohol consumption binge drinking   5. Need for immunization against influenza     Tests ordered Orders Placed This Encounter  Procedures  . Flu Vaccine QUAD 36+ mos IM  . Ambulatory referral to Psychology     Plan: Please see assessment and plan per problem list above.   Meds ordered this encounter  Medications  . busPIRone (BUSPAR) 10 MG tablet    Sig: Take 0.5 tablets (5 mg total) by mouth 3 (three) times daily.    Dispense:  90 tablet    Refill:  1    Order Specific Question:   Supervising Provider    Answer:   SIMPSON, MARGARET E [[id number]]  . sertraline (ZOLOFT) 50 MG tablet     Sig: Take 1 tablet (50 mg total) by mouth daily.    Dispense:  30 tablet    Refill:  2    Order Specific Question:   Supervising Provider    Answer:   SIMPSON, MARGARET E [[id number]]  .  omeprazole (PRILOSEC) 40 MG capsule    Sig: Take 1 capsule (40 mg total) by mouth daily. Take on empty stomach 1 hour prior to eating or drinking    Dispense:  30 capsule    Refill:  3    Order Specific Question:   Supervising Provider    Answer:   Genia Harold    Patient to follow-up in 07/08/2020   Freddy Finner, NP

## 2020-06-24 NOTE — Assessment & Plan Note (Signed)
Indigestion most likely related to binge drinking.  I have encouraged her to stop binge drinking and advised to have her follow-up with therapy will start Prilosec to see if that will be helpful.  Close follow-up 2 weeks

## 2020-06-24 NOTE — Assessment & Plan Note (Signed)
Worsening given situations that are going on.  In addition to drinking binge frequently throughout the week.  Increase BuSpar and Zoloft and therapy for psychology ordered.

## 2020-07-01 ENCOUNTER — Ambulatory Visit (INDEPENDENT_AMBULATORY_CARE_PROVIDER_SITE_OTHER): Payer: 59 | Admitting: Professional

## 2020-07-01 DIAGNOSIS — F4323 Adjustment disorder with mixed anxiety and depressed mood: Secondary | ICD-10-CM

## 2020-07-08 ENCOUNTER — Other Ambulatory Visit: Payer: Self-pay

## 2020-07-08 ENCOUNTER — Telehealth: Payer: 59 | Admitting: Family Medicine

## 2020-07-12 ENCOUNTER — Ambulatory Visit
Admission: EM | Admit: 2020-07-12 | Discharge: 2020-07-12 | Disposition: A | Discharge status: Home / Self Care | Payer: 59 | Attending: Family Medicine | Admitting: Family Medicine

## 2020-07-12 ENCOUNTER — Encounter: Payer: Self-pay | Admitting: Emergency Medicine

## 2020-07-12 DIAGNOSIS — J020 Streptococcal pharyngitis: Secondary | ICD-10-CM | POA: Diagnosis not present

## 2020-07-12 LAB — POCT RAPID STREP A (OFFICE): Rapid Strep A Screen: POSITIVE — AB

## 2020-07-12 MED ORDER — CEPHALEXIN 500 MG PO CAPS: 500.0000 mg | ORAL_CAPSULE | Freq: Two times a day (BID) | ORAL | 0 refills | Status: DC

## 2020-07-12 NOTE — Discharge Instructions (Addendum)
Take Tylenol or ibuprofen for pain Drink more water and fluids Take antibiotic 2 times a day for 10 full days Call or return if not improving in 2 to 3 days

## 2020-07-12 NOTE — ED Triage Notes (Addendum)
LT ear pain, cough and sore throat.  Pt states her tonsils look like they have blisters on them. Declined covid/flu test

## 2020-07-13 NOTE — ED Provider Notes (Signed)
[facility]    CSN: [id number] Arrival date & time: 07/12/20  1613      History   Chief Complaint Chief Complaint  Patient presents with  . Cough    HPI Kim [name] is a 26 y.o. female.   HPI  Patient is here for severe sore throat, trouble swallowing, headache, fatigue.  Mild cough.  Left ear pain.  No fever.  No exposure to illness known  Past Medical History:  Diagnosis Date  . ADHD (attention deficit hyperactivity disorder)   . Anxiety   . Depression   . Encounter for gynecological examination with Papanicolaou smear of cervix 03/27/2017  . Encounter for surveillance of contraceptive pills 03/27/2017  . Migraines     Patient Active Problem List   Diagnosis Date Noted  . Severe episode of recurrent major depressive disorder, without psychotic features (HCC) 06/24/2020  . GAD (generalized anxiety disorder) 06/24/2020  . Indigestion 06/24/2020  . Need for immunization against influenza 06/24/2020  . Alcohol consumption binge drinking 06/24/2020  . Depression, major, single episode, mild (HCC) 12/06/2019  . Panic attacks 12/05/2019  . Fatigue 11/22/2019  . Anxiety 11/22/2019    History reviewed. No pertinent surgical history.  OB History    Gravida  0   Para  0   Term  0   Preterm  0   AB  0   Living  0     SAB  0   IAB  0   Ectopic  0   Multiple  0   Live Births  0            Home Medications    Prior to Admission medications   Medication Sig Start Date End Date Taking? Authorizing Provider  cephALEXin (KEFLEX) 500 MG capsule Take 1 capsule (500 mg total) by mouth 2 (two) times daily. 07/12/20  Yes Eustace Moore, MD  acetaminophen (TYLENOL) 500 MG tablet Take 500 mg by mouth every 6 (six) hours as needed.    [provider]  busPIRone (BUSPAR) 10 MG tablet Take 0.5 tablets (5 mg total) by mouth 3 (three) times daily. 06/24/20   Freddy Finner, NP  butalbital-acetaminophen-caffeine (FIORICET, ESGIC)  [phone] MG tablet Take 1-2 tablets by mouth every 4 (four) hours as needed for headache. Max 6 caps/day 09/18/18   Domenick Gong, MD  hydrOXYzine (ATARAX/VISTARIL) 25 MG tablet Take 1 tablet (25 mg total) by mouth at bedtime. 12/05/19   Freddy Finner, NP  omeprazole (PRILOSEC) 40 MG capsule Take 1 capsule (40 mg total) by mouth daily. Take on empty stomach 1 hour prior to eating or drinking 06/24/20   Freddy Finner, NP  sertraline (ZOLOFT) 50 MG tablet Take 1 tablet (50 mg total) by mouth daily. 06/24/20   Freddy Finner, NP  UNABLE TO FIND daily. Vit B,C,D3,Zinc    [provider]    Family History Family History  Problem Relation Age of Onset  . Cancer Paternal Grandfather   . Alzheimer's disease Paternal Grandmother   . Cancer Maternal Grandmother        breast   . Cancer Maternal Grandfather        pancreatic and bone  . Cancer Father        lung  . Hypertension Mother     Social History Social History   Tobacco Use  . Smoking status: Never Smoker  . Smokeless tobacco: Never Used  Vaping Use  . Vaping Use: Every day  Substance Use Topics  . Alcohol use: Yes    Comment: monthly  . Drug use: No     Allergies   Amoxicillin, Penicillins, Amoxicillin, Doxycycline, Dicyclomine, and Latex   Review of Systems Review of Systems  See HPI Physical Exam Triage Vital Signs ED Triage Vitals  Enc Vitals Group     BP 07/12/20 1753 127/77     Pulse Rate 07/12/20 1753 93     Resp 07/12/20 1753 18     Temp 07/12/20 1753 97.8 F (36.6 C)     Temp Source 07/12/20 1753 Oral     SpO2 07/12/20 1753 98 %     Weight 07/12/20 1751 182 lb 15.7 oz (83 kg)     Height 07/12/20 1751 5\' 11"  (1.803 m)     Head Circumference --      Peak Flow --      Pain Score 07/12/20 1751 7     Pain Loc --      Pain Edu? --      Excl. in [place]? --    No data found.  Updated Vital Signs BP 127/77 (BP Location: Right Arm)   Pulse 93   Temp 97.8 F (36.6 C) (Oral)   Resp 18    Ht 5\' 11"  (1.803 m)   Wt 83 kg   SpO2 98%   BMI 25.52 kg/m      Physical Exam Constitutional:      General: She is not in acute distress.    Appearance: She is well-developed and well-nourished.  HENT:     Head: Normocephalic and atraumatic.     Right Ear: Tympanic membrane and ear canal normal.     Left Ear: Tympanic membrane and ear canal normal.     Nose: Congestion present.     Mouth/Throat:     Mouth: Mucous membranes are moist.     Pharynx: Posterior oropharyngeal erythema present.     Comments: Large tonsils, erythema, no exudate Eyes:     Conjunctiva/sclera: Conjunctivae normal.     Pupils: Pupils are equal, round, and reactive to light.  Cardiovascular:     Rate and Rhythm: Normal rate and regular rhythm.     Heart sounds: Normal heart sounds.  Pulmonary:     Effort: Pulmonary effort is normal. No respiratory distress.     Breath sounds: Normal breath sounds.  Abdominal:     General: There is no distension.     Palpations: Abdomen is soft.  Musculoskeletal:        General: No edema. Normal range of motion.     Cervical back: Normal range of motion.  Lymphadenopathy:     Cervical: Cervical adenopathy present.  Skin:    General: Skin is warm and dry.  Neurological:     Mental Status: She is alert.  Psychiatric:        Behavior: Behavior normal.      UC Treatments / Results  Labs (all labs ordered are listed, but only abnormal results are displayed) Labs Reviewed  POCT RAPID STREP A (OFFICE) - Abnormal; Notable for the following components:      Result Value   Rapid Strep A Screen Positive (*)    All other components within normal limits    EKG   Radiology No results found.  Procedures Procedures (including critical care time)  Medications Ordered in UC Medications - No data to display  Initial Impression / Assessment and Plan / UC Course  I have reviewed the triage  vital signs and the nursing notes.  Pertinent labs & imaging results that  were available during my care of the patient were reviewed by me and considered in my medical decision making (see chart for details).    Final Clinical Impressions(s) / UC Diagnoses   Final diagnoses:  Streptococcal sore throat     Discharge Instructions     Take Tylenol or ibuprofen for pain Drink more water and fluids Take antibiotic 2 times a day for 10 full days Call or return if not improving in 2 to 3 days   ED Prescriptions    Medication Sig Dispense Auth. Provider   cephALEXin (KEFLEX) 500 MG capsule Take 1 capsule (500 mg total) by mouth 2 (two) times daily. 20 capsule Eustace Moore, MD     PDMP not reviewed this encounter.   Eustace Moore, MD 07/13/20 [phone]

## 2020-07-18 ENCOUNTER — Ambulatory Visit (INDEPENDENT_AMBULATORY_CARE_PROVIDER_SITE_OTHER): Payer: 59 | Admitting: Professional

## 2020-07-18 DIAGNOSIS — F4323 Adjustment disorder with mixed anxiety and depressed mood: Secondary | ICD-10-CM

## 2020-07-23 ENCOUNTER — Encounter: Payer: Self-pay | Admitting: Family Medicine

## 2020-07-23 ENCOUNTER — Telehealth: Payer: 59 | Admitting: Family Medicine

## 2020-07-23 ENCOUNTER — Other Ambulatory Visit: Payer: Self-pay

## 2020-07-23 VITALS — BP 127/77 | Ht 71.0 in | Wt 183.0 lb

## 2020-07-23 DIAGNOSIS — R197 Diarrhea, unspecified: Secondary | ICD-10-CM | POA: Diagnosis not present

## 2020-07-23 DIAGNOSIS — F411 Generalized anxiety disorder: Secondary | ICD-10-CM | POA: Diagnosis not present

## 2020-07-23 DIAGNOSIS — F1010 Alcohol abuse, uncomplicated: Secondary | ICD-10-CM

## 2020-07-23 DIAGNOSIS — F332 Major depressive disorder, recurrent severe without psychotic features: Secondary | ICD-10-CM

## 2020-07-23 NOTE — Assessment & Plan Note (Signed)
Much improved we will continue to follow monitor her and if encouraged her to do therapy as she has been.

## 2020-07-23 NOTE — Assessment & Plan Note (Addendum)
Showing some moderate improvement.  PHQ-9 is reduced.  We will continue Zoloft at order dose at this time.  Follow-up in 6 to 8 weeks to see how she is doing then.  Patient is in therapy now as well.  She denies having any SI or HI today.

## 2020-07-23 NOTE — Patient Instructions (Signed)
  I appreciate the opportunity to provide you with care for your health and wellness.  Follow up: 6 to 8 weeks mood  No labs or referrals today  Call if you need a referral for GI- happy to do that  I am proud of you taking control of things that were causing your harm, please continue this self focus.  Please continue to practice social distancing to keep you, your family, and our community safe.  If you must go out, please wear a mask and practice good handwashing.  It was a pleasure to see you and I look forward to continuing to work together on your health and well-being. Please do not hesitate to call the office if you need care or have questions about your care.  Have a wonderful day. With Gratitude, Tereasa Coop, DNP, AGNP-BC

## 2020-07-23 NOTE — Assessment & Plan Note (Signed)
Ongoing stool changes with a history of IBS.  Advised her to reach out to her GI provider.

## 2020-07-23 NOTE — Progress Notes (Signed)
Virtual Visit via Telephone Note   This visit type was conducted due to national recommendations for restrictions regarding the COVID-19 Pandemic (e.g. social distancing) in an effort to limit this patient's exposure and mitigate transmission in our community.  Due to her co-morbid illnesses, this patient is at least at moderate risk for complications without adequate follow up.  This format is felt to be most appropriate for this patient at this time.  The patient did not have access to video technology/had technical difficulties with video requiring transitioning to audio format only (telephone).  All issues noted in this document were discussed and addressed.  No physical exam could be performed with this format.    Evaluation Performed:  Follow-up visit  Date:  07/23/2020   ID:  Kim [name], DOB 1996-02-03, MRN [id number]  Patient Location: Home Provider Location: Office/Clinic   Participants: Nurse/CMA for intake and work up; Patient and Provider for Visit and Wrap up  Method of visit: Telephone or  Location of Patient: Home Location of Provider: Office Consent was obtain for visit over the telephone. Services rendered by provider: Visit was performed via telephone  I verified that I am speaking with the correct person using two identifiers.  PCP:  Freddy Finner, NP   Chief Complaint: Anxiety, depression, binge drinking  History of Present Illness:    Kim [name] is a 25 y.o. female with history as stated below.  Previously seen in December secondary to having worsening anxiety and depression after having separation from fianc.  She reported having episodes of binge drinking several nights a week.  That was causing her nausea and upset stomach.  And aggravated her IBS.  She reports that she has changed her drinking habits and is only drinking 2-3 beers a week if that.  And go several days without them.  She does report having some withdrawal-like symptoms  at times but nothing that she is not able to manage at home.  She is with her parents and able to have help and guidance during this time as well.  She is also started seeing a therapist and is seeing her about once a week at this time.  She was started on Zoloft and BuSpar and Vistaril to see if we could help get her anxiety and depression are controlled.  She reports that she is feeling much better as far as her anxiety and depression go however she notes when she does take her BuSpar she does have a little chest tightness.  She does have chest pain at times secondary to taking BuSpar but she does not necessarily think it is caused by the BuSpar as she has had chest discomfort before that she has been seen in the emergency room for and they advised her to take ibuprofen.   She is going to call the GI office and see if they could see her secondary to her ongoing stool changes.  She denies having any blood in her stool.  But but she does report some mucus and stickiness with her stools.  Sometimes wateriness.    She denies having any active chest pain today while on the phone, leg swelling, palpitations, dizziness, headaches or blurred vision.   The patient does not have symptoms concerning for COVID-19 infection (fever, chills, cough, or new shortness of breath).   Past Medical, Surgical, Social History, Allergies, and Medications have been Reviewed.  Past Medical History:  Diagnosis Date  . ADHD (attention deficit hyperactivity disorder)   .  Anxiety   . Depression   . Encounter for gynecological examination with Papanicolaou smear of cervix 03/27/2017  . Encounter for surveillance of contraceptive pills 03/27/2017  . Migraines    History reviewed. No pertinent surgical history.   Current Meds  Medication Sig  . acetaminophen (TYLENOL) 500 MG tablet Take 500 mg by mouth every 6 (six) hours as needed.  . busPIRone (BUSPAR) 10 MG tablet Take 0.5 tablets (5 mg total) by mouth 3 (three) times  daily.  . butalbital-acetaminophen-caffeine (FIORICET, ESGIC) 50-325-40 MG tablet Take 1-2 tablets by mouth every 4 (four) hours as needed for headache. Max 6 caps/day  . cephALEXin (KEFLEX) 500 MG capsule Take 1 capsule (500 mg total) by mouth 2 (two) times daily.  . hydrOXYzine (ATARAX/VISTARIL) 25 MG tablet Take 1 tablet (25 mg total) by mouth at bedtime.  Marland Kitchen omeprazole (PRILOSEC) 40 MG capsule Take 1 capsule (40 mg total) by mouth daily. Take on empty stomach 1 hour prior to eating or drinking  . sertraline (ZOLOFT) 50 MG tablet Take 1 tablet (50 mg total) by mouth daily.  Marland Kitchen UNABLE TO FIND daily. Vit B,C,D3,Zinc     Allergies:   Amoxicillin, Penicillins, Amoxicillin, Doxycycline, Dicyclomine, and Latex   ROS:   Please see the history of present illness.    All other systems reviewed and are negative.   Labs/Other Tests and Data Reviewed:    Recent Labs: 11/20/2019: ALT 8; BUN 10; Creat 0.69; Hemoglobin 14.0; Platelets 251; Potassium 4.3; Sodium 138; TSH 1.86   Recent Lipid Panel No results found for: CHOL, TRIG, HDL, CHOLHDL, LDLCALC, LDLDIRECT  Wt Readings from Last 3 Encounters:  07/23/20 183 lb (83 kg)  07/12/20 182 lb 15.7 oz (83 kg)  06/24/20 184 lb (83.5 kg)     Objective:    Vital Signs:  BP 127/77   Ht 5\' 11"  (1.803 m)   Wt 183 lb (83 kg)   BMI 25.52 kg/m    VITAL SIGNS:  reviewed GEN:  no acute distress RESPIRATORY:  No shortness of breath noted in conversation PSYCH:  normal affect   Depression screen [facility] 2/9 07/23/2020 06/24/2020 01/02/2020 12/06/2019 03/27/2017  Decreased Interest 1 1 0 3 2  Down, Depressed, Hopeless 1 1 1 3 3   PHQ - 2 Score 2 2 1 6 5   Altered sleeping 1 1 0 3 3  Tired, decreased energy 2 1 1 3 2   Change in appetite 0 1 0 3 2  Feeling bad or failure about yourself  1 1 0 3 3  Trouble concentrating 0 1 0 1 0  Moving slowly or fidgety/restless 0 0 0 1 1  Suicidal thoughts 0 1 0 3 1  PHQ-9 Score 6 8 2 23 17   Difficult doing work/chores  Somewhat difficult Somewhat difficult Somewhat difficult Very difficult Not difficult at all   GAD 7 : Generalized Anxiety Score 07/23/2020 06/24/2020 01/02/2020 12/06/2019  Nervous, Anxious, on Edge 3 1 1 3   Control/stop worrying 3 1 1 3   Worry too much - different things 3 1 3 3   Trouble relaxing 3 1 2  0  Restless 3 1 0 1  Easily annoyed or irritable 3 1 3 3   Afraid - awful might happen 3 1 3 3   Total GAD 7 Score 21 7 13 16   Anxiety Difficulty Very difficult Somewhat difficult Somewhat difficult Very difficult      ASSESSMENT & PLAN:     1. Severe episode of recurrent major depressive disorder, without psychotic  features (HCC)   2. GAD (generalized anxiety disorder)   3. Alcohol consumption binge drinking   4. Diarrhea, unspecified type   Time:   Today, I have spent 15 minutes with the patient with telehealth technology discussing the above problems.     Medication Adjustments/Labs and Tests Ordered: Current medicines are reviewed at length with the patient today.  Concerns regarding medicines are outlined above.   Tests Ordered: No orders of the defined types were placed in this encounter.   Medication Changes: No orders of the defined types were placed in this encounter.    Disposition:  Follow up 6-8 weeks  Signed, Freddy Finner, NP  07/23/2020 11:14 AM     [facility]

## 2020-07-23 NOTE — Assessment & Plan Note (Signed)
GAD score is elevated today however she reports not having anxiety when she is in communication over the phone.  She reports that is intermittent and when it is bad it is bad.  But she can tell that the Zoloft and BuSpar helpful.  She reports that the BuSpar feels like it might give her chest tightness.  But she is unsure if it is that versus her anxiety and history of having chest discomfort or chest wall pain.  She reports taking ibuprofen and it helps relieve this.  However she is taking a lot of ibuprofen which might be upsetting her stomach as well.  She is advised to not take ibuprofen as much as she has been taking it and to reduce the dose of BuSpar to see if that is helpful.

## 2020-07-27 ENCOUNTER — Ambulatory Visit (INDEPENDENT_AMBULATORY_CARE_PROVIDER_SITE_OTHER): Payer: 59 | Admitting: Professional

## 2020-07-27 DIAGNOSIS — F4323 Adjustment disorder with mixed anxiety and depressed mood: Secondary | ICD-10-CM

## 2020-07-29 ENCOUNTER — Ambulatory Visit: Payer: 59 | Admitting: Family Medicine

## 2020-07-31 MED FILL — busPIRone HCL 10 MG TABS: 10 | 30 days supply | Qty: 45 | Fill #0

## 2020-07-31 MED FILL — SERTRALINE HCL 50 MG TABS: 50 | 30 days supply | Qty: 30 | Fill #0

## 2020-07-31 MED FILL — OMEPRAZOLE 40 MG CPDR: 40 | 30 days supply | Qty: 30 | Fill #0

## 2020-08-03 ENCOUNTER — Ambulatory Visit (INDEPENDENT_AMBULATORY_CARE_PROVIDER_SITE_OTHER): Payer: 59 | Admitting: Professional

## 2020-08-03 DIAGNOSIS — F4323 Adjustment disorder with mixed anxiety and depressed mood: Secondary | ICD-10-CM

## 2020-08-10 ENCOUNTER — Ambulatory Visit: Payer: 59 | Admitting: Professional

## 2020-08-17 ENCOUNTER — Ambulatory Visit (INDEPENDENT_AMBULATORY_CARE_PROVIDER_SITE_OTHER): Payer: 59 | Admitting: Professional

## 2020-08-17 DIAGNOSIS — F4323 Adjustment disorder with mixed anxiety and depressed mood: Secondary | ICD-10-CM

## 2020-08-25 ENCOUNTER — Other Ambulatory Visit: Payer: Self-pay

## 2020-08-25 ENCOUNTER — Emergency Department (HOSPITAL_COMMUNITY): Payer: 59

## 2020-08-25 ENCOUNTER — Emergency Department (HOSPITAL_COMMUNITY)
Admission: EM | Admit: 2020-08-25 | Discharge: 2020-08-25 | Disposition: A | Discharge status: Home / Self Care | Payer: 59 | Attending: Emergency Medicine | Admitting: Emergency Medicine

## 2020-08-25 ENCOUNTER — Encounter (HOSPITAL_COMMUNITY): Payer: Self-pay | Admitting: *Deleted

## 2020-08-25 DIAGNOSIS — Z91040 Latex allergy status: Secondary | ICD-10-CM | POA: Diagnosis not present

## 2020-08-25 DIAGNOSIS — R079 Chest pain, unspecified: Secondary | ICD-10-CM | POA: Diagnosis not present

## 2020-08-25 DIAGNOSIS — R42 Dizziness and giddiness: Secondary | ICD-10-CM | POA: Diagnosis not present

## 2020-08-25 DIAGNOSIS — R0602 Shortness of breath: Secondary | ICD-10-CM | POA: Insufficient documentation

## 2020-08-25 DIAGNOSIS — R0789 Other chest pain: Secondary | ICD-10-CM | POA: Insufficient documentation

## 2020-08-25 DIAGNOSIS — F419 Anxiety disorder, unspecified: Secondary | ICD-10-CM | POA: Diagnosis not present

## 2020-08-25 LAB — CBC
HCT: 37.5 % (ref 36.0–46.0)
Hemoglobin: 12.6 g/dL (ref 12.0–15.0)
MCH: 31.2 pg (ref 26.0–34.0)
MCHC: 33.6 g/dL (ref 30.0–36.0)
MCV: 92.8 fL (ref 80.0–100.0)
Platelets: 217 10*3/uL (ref 150–400)
RBC: 4.04 MIL/uL (ref 3.87–5.11)
RDW: 12.2 % (ref 11.5–15.5)
WBC: 6.1 10*3/uL (ref 4.0–10.5)
nRBC: 0 % (ref 0.0–0.2)

## 2020-08-25 LAB — BASIC METABOLIC PANEL
Anion gap: 5 (ref 5–15)
BUN: 7 mg/dL (ref 6–20)
CO2: 23 mmol/L (ref 22–32)
Calcium: 9 mg/dL (ref 8.9–10.3)
Chloride: 107 mmol/L (ref 98–111)
Creatinine, Ser: 0.51 mg/dL (ref 0.44–1.00)
GFR, Estimated: >60 mL/min (ref >60)
Glucose, Bld: 92 mg/dL (ref 70–99)
Potassium: 3.8 mmol/L (ref 3.5–5.1)
Sodium: 135 mmol/L (ref 135–145)

## 2020-08-25 LAB — TROPONIN I (HIGH SENSITIVITY)
Troponin I (High Sensitivity): <2 ng/L (ref <18)
Troponin I (High Sensitivity): <2 ng/L (ref <18)

## 2020-08-25 MED ORDER — ACETAMINOPHEN 325 MG PO TABS
650.0000 mg | ORAL_TABLET | Freq: Once | ORAL | Status: AC
  Administered 2020-08-25: 650 mg via ORAL
  Filled 2020-08-25: qty 2

## 2020-08-25 MED ORDER — LORAZEPAM 0.5 MG PO TABS
0.5000 mg | ORAL_TABLET | Freq: Once | ORAL | Status: AC
  Administered 2020-08-25: 0.5 mg via ORAL
  Filled 2020-08-25: qty 1

## 2020-08-25 NOTE — Discharge Instructions (Addendum)
Your work-up today was reassuring, if you have continuing chest pain you can always make an appointment with the cardiologist, their information is provided.  Please use the attached instructions, continue to treat this as chest wall pain, I think this is most likely due to anxiety as we discussed.  I need you to follow-up with your primary care about this in regards to medications..  If you have any new or worsening concerning symptoms please come back to the emergency department.  Get help right away if: Your chest pain is worse. You have a cough that gets worse, or you cough up blood. You have very bad (severe) pain in your belly (abdomen). You pass out (faint). You have either of these for no clear reason: Sudden chest discomfort. Sudden discomfort in your arms, back, neck, or jaw. You have shortness of breath at any time. You suddenly start to sweat, or your skin gets clammy. You feel sick to your stomach (nauseous). You throw up (vomit). You suddenly feel lightheaded or dizzy. You feel very weak or tired. Your heart starts to beat fast, or it feels like it is skipping beats.

## 2020-08-25 NOTE — ED Provider Notes (Signed)
[facility] EMERGENCY DEPARTMENT Provider Note   CSN: [id number] Arrival date & time: 08/25/20  1302     History Chief Complaint  Patient presents with  . Chest Pain    Kim [name] is a 25 y.o. female past medical history of ADHD, anxiety, depression, migraines, panic attacks that presents to the emergency department today for chest pain.  Patient states that she has been having ongoing intermittent chest pain since October of last year, states that she was seen at that time and diagnosed with chest wall pain due to her anxiety.  States that she had been trying to treat this with ibuprofen, cool compresses, and taking her Zoloft and bupropion without much relief.  States that today the chest pain was worse, states that she had some dizziness with it as well and some shortness of breath, started while she was at work.  Patient works as a Charity fundraiser.  States that the chest pain went down into her left arm, states that her chest and her left arm are tender to touch.  No trauma.  No nausea, vomiting, diaphoresis, pain elsewhere.  No joint pain, fevers, chills, weight changes, leg swelling.  No back pain.  Chest pain is sharp and constant for the past day.  Denies any numbness or tingling.  Patient states that dizziness feels like a lightheadedness, no vertigo or room spinning.  States that she did eat and drink this morning.  No alcohol or drug use.  No cardiac history.  Denies any cough or URI symptoms, has been vaccinated against COVID.  Patient recently had strep a month and a half ago, states that she took all her antibiotics appropriately.  No other complaints at this time.  No headache or head trauma, vision changes or neck pain.  HPI     Past Medical History:  Diagnosis Date  . ADHD (attention deficit hyperactivity disorder)   . Anxiety   . Depression   . Encounter for gynecological examination with Papanicolaou smear of cervix 03/27/2017  . Encounter for surveillance of  contraceptive pills 03/27/2017  . Migraines     Patient Active Problem List   Diagnosis Date Noted  . Diarrhea 07/23/2020  . Severe episode of recurrent major depressive disorder, without psychotic features ([place]) 06/24/2020  . GAD (generalized anxiety disorder) 06/24/2020  . Indigestion 06/24/2020  . Need for immunization against influenza 06/24/2020  . Alcohol consumption binge drinking 06/24/2020  . Panic attacks 12/05/2019  . Fatigue 11/22/2019  . Anxiety 11/22/2019    History reviewed. No pertinent surgical history.   OB History    Gravida  0   Para  0   Term  0   Preterm  0   AB  0   Living  0     SAB  0   IAB  0   Ectopic  0   Multiple  0   Live Births  0           Family History  Problem Relation Age of Onset  . Cancer Paternal Grandfather   . Alzheimer's disease Paternal Grandmother   . Cancer Maternal Grandmother        breast   . Cancer Maternal Grandfather        pancreatic and bone  . Cancer Father        lung  . Hypertension Mother     Social History   Tobacco Use  . Smoking status: Never Smoker  . Smokeless tobacco: Never Used  Vaping  Use  . Vaping Use: Every day  Substance Use Topics  . Alcohol use: Yes    Comment: monthly  . Drug use: No    Home Medications Prior to Admission medications   Medication Sig Start Date End Date Taking? Authorizing Provider  acetaminophen (TYLENOL) 500 MG tablet Take 500 mg by mouth every 6 (six) hours as needed.   Yes [provider]  busPIRone (BUSPAR) 10 MG tablet Take 0.5 tablets (5 mg total) by mouth 3 (three) times daily. 06/24/20  Yes Perlie Mayo, NP  sertraline (ZOLOFT) 50 MG tablet Take 1 tablet (50 mg total) by mouth daily. 06/24/20  Yes Perlie Mayo, NP  butalbital-acetaminophen-caffeine (FIORICET, ESGIC) [phone] MG tablet Take 1-2 tablets by mouth every 4 (four) hours as needed for headache. Max 6 caps/day Patient not taking: Reported on 08/25/2020 09/18/18    Melynda Ripple, MD  hydrOXYzine (ATARAX/VISTARIL) 25 MG tablet Take 1 tablet (25 mg total) by mouth at bedtime. Patient not taking: Reported on 08/25/2020 12/05/19   Perlie Mayo, NP  omeprazole (PRILOSEC) 40 MG capsule Take 1 capsule (40 mg total) by mouth daily. Take on empty stomach 1 hour prior to eating or drinking Patient not taking: Reported on 08/25/2020 06/24/20   Perlie Mayo, NP    Allergies    Amoxicillin, Penicillins, Amoxicillin, Doxycycline, Dicyclomine, and Latex  Review of Systems   Review of Systems  Constitutional: Negative for chills, diaphoresis, fatigue and fever.  HENT: Negative for congestion, sore throat and trouble swallowing.   Eyes: Negative for pain and visual disturbance.  Respiratory: Positive for shortness of breath. Negative for cough and wheezing.   Cardiovascular: Positive for chest pain. Negative for palpitations and leg swelling.  Gastrointestinal: Negative for abdominal distention, abdominal pain, diarrhea, nausea and vomiting.  Genitourinary: Negative for difficulty urinating.  Musculoskeletal: Negative for back pain, neck pain and neck stiffness.  Skin: Negative for pallor.  Neurological: Positive for dizziness. Negative for speech difficulty, weakness and headaches.  Psychiatric/Behavioral: Negative for confusion.    Physical Exam Updated Vital Signs BP 108/79   Pulse 94   Temp 97.9 F (36.6 C) (Oral)   Resp 16   LMP 08/04/2020   SpO2 100%   Physical Exam Constitutional:      General: She is not in acute distress.    Appearance: Normal appearance. She is not ill-appearing, toxic-appearing or diaphoretic.     Comments: Patient is extremely tearful on exam, no acute distress.  HENT:     Mouth/Throat:     Mouth: Mucous membranes are moist.     Pharynx: Oropharynx is clear.  Eyes:     General: No scleral icterus.    Extraocular Movements: Extraocular movements intact.     Pupils: Pupils are equal, round, and reactive to light.   Cardiovascular:     Rate and Rhythm: Normal rate and regular rhythm.     Pulses: Normal pulses.     Heart sounds: Normal heart sounds.  Pulmonary:     Effort: Pulmonary effort is normal. No respiratory distress.     Breath sounds: Normal breath sounds. No stridor. No wheezing, rhonchi or rales.  Chest:     Chest wall: No tenderness.       Comments: Patient with tenderness to palpation of chest, sternum to left chest wall.  No erythema or rashes noted.  No warmth. Abdominal:     General: Abdomen is flat. There is no distension.     Palpations: Abdomen is soft.  Tenderness: There is no abdominal tenderness. There is no guarding or rebound.  Musculoskeletal:        General: No swelling or tenderness. Normal range of motion.     Cervical back: Normal range of motion and neck supple. No rigidity.     Right lower leg: No edema.     Left lower leg: No edema.  Skin:    General: Skin is warm and dry.     Capillary Refill: Capillary refill takes less than 2 seconds.     Coloration: Skin is not pale.  Neurological:     General: No focal deficit present.     Mental Status: She is alert and oriented to person, place, and time.  Psychiatric:        Mood and Affect: Mood normal.        Behavior: Behavior normal.     ED Results / Procedures / Treatments   Labs (all labs ordered are listed, but only abnormal results are displayed) Labs Reviewed  BASIC METABOLIC PANEL  CBC  TROPONIN I (HIGH SENSITIVITY)  TROPONIN I (HIGH SENSITIVITY)    EKG EKG Interpretation  Date/Time:  Tuesday August 25 2020 13:03:03 EST Ventricular Rate:  99 PR Interval:  122 QRS Duration: 84 QT Interval:  338 QTC Calculation: 433 R Axis:   100 Text Interpretation: Normal sinus rhythm with sinus arrhythmia Rightward axis Borderline ECG No significant change since last tracing Confirmed by Calvert Cantor [phone]) on 08/25/2020 1:13:57 PM   Radiology DG Chest Port 1 View  Result Date:  08/25/2020 CLINICAL DATA:  Chest pain EXAM: PORTABLE CHEST 1 VIEW COMPARISON:  04/02/2012 FINDINGS: The heart size and mediastinal contours are within normal limits. Both lungs are clear. The visualized skeletal structures are unremarkable. IMPRESSION: No acute abnormality of the lungs. Electronically Signed   By: Eddie Candle M.D.   On: 08/25/2020 14:25    Procedures Procedures   Medications Ordered in ED Medications  acetaminophen (TYLENOL) tablet 650 mg (650 mg Oral Given 08/25/20 1403)  LORazepam (ATIVAN) tablet 0.5 mg (0.5 mg Oral Given 08/25/20 1517)    ED Course  I have reviewed the triage vital signs and the nursing notes.  Pertinent labs & imaging results that were available during my care of the patient were reviewed by me and considered in my medical decision making (see chart for details).    MDM Rules/Calculators/A&P                          Kim [name] is a 25 y.o. female past medical history of ADHD, anxiety, depression, migraines, panic attacks that presents to the emergency department today for chest pain.  Chest pain has been present intermittently since October, worse today, patient is a healthy 25 year old.  Chest pain most likely anxiety related, is tender to palpation.  Work-up today with BMP and CBC unremarkable.  Troponin less than 2.  Chest x-ray interpreted me with any acute cardiopulmonary disease.  EKG department with any signs of ischemia or arrhythmia.No concerns for rheumatic disease with recent strep diagnosis, took all of her antibiotics, patient is afebrile, EKG without any changes, no prolonged PR.  No major Jones criteria met.  Ativan given, will reevaluate.  Negative orthostatics.  Upon reevaluation, patient states that she feels much better with Ativan.  Pain and shortness of breath and dizziness resolved.  I think that patient most likely had panic attack, did discuss this with patient.  Patient agreeable for  PCP follow-up, still wants referral  to cardiology.  Patient be discharged at this time.  Doubt need for further emergent work up at this time. I explained the diagnosis and have given explicit precautions to return to the ER including for any other new or worsening symptoms. The patient understands and accepts the medical plan as it's been dictated and I have answered their questions. Discharge instructions concerning home care and prescriptions have been given. The patient is STABLE and is discharged to home in good condition.   Final Clinical Impression(s) / ED Diagnoses Final diagnoses:  Chest wall pain  Anxiety    Rx / DC Orders ED Discharge Orders    None       Alfredia Client, PA-C 08/25/20 1621    Truddie Hidden, MD 08/26/20 [phone]

## 2020-08-25 NOTE — ED Triage Notes (Signed)
Chest pain with dizziness 

## 2020-08-31 ENCOUNTER — Ambulatory Visit: Payer: 59 | Admitting: Professional

## 2020-09-01 ENCOUNTER — Ambulatory Visit (INDEPENDENT_AMBULATORY_CARE_PROVIDER_SITE_OTHER): Payer: 59 | Admitting: Professional

## 2020-09-01 DIAGNOSIS — F411 Generalized anxiety disorder: Secondary | ICD-10-CM

## 2020-09-01 DIAGNOSIS — F4323 Adjustment disorder with mixed anxiety and depressed mood: Secondary | ICD-10-CM | POA: Diagnosis not present

## 2020-09-14 ENCOUNTER — Ambulatory Visit: Payer: 59 | Admitting: Professional

## 2020-09-17 ENCOUNTER — Ambulatory Visit (INDEPENDENT_AMBULATORY_CARE_PROVIDER_SITE_OTHER): Payer: 59 | Admitting: Professional

## 2020-09-17 DIAGNOSIS — F4323 Adjustment disorder with mixed anxiety and depressed mood: Secondary | ICD-10-CM

## 2020-09-17 DIAGNOSIS — F411 Generalized anxiety disorder: Secondary | ICD-10-CM | POA: Diagnosis not present

## 2020-09-24 ENCOUNTER — Ambulatory Visit: Payer: 59 | Admitting: Family Medicine

## 2020-09-28 ENCOUNTER — Ambulatory Visit: Payer: 59 | Admitting: Professional

## 2020-09-29 ENCOUNTER — Other Ambulatory Visit (HOSPITAL_BASED_OUTPATIENT_CLINIC_OR_DEPARTMENT_OTHER): Payer: Self-pay

## 2020-10-12 ENCOUNTER — Ambulatory Visit (INDEPENDENT_AMBULATORY_CARE_PROVIDER_SITE_OTHER): Payer: 59 | Admitting: Professional

## 2020-10-12 DIAGNOSIS — F4323 Adjustment disorder with mixed anxiety and depressed mood: Secondary | ICD-10-CM

## 2020-10-12 DIAGNOSIS — F411 Generalized anxiety disorder: Secondary | ICD-10-CM

## 2020-10-16 ENCOUNTER — Ambulatory Visit: Payer: 59 | Admitting: Internal Medicine

## 2020-10-27 ENCOUNTER — Other Ambulatory Visit (HOSPITAL_COMMUNITY): Payer: Self-pay

## 2020-10-27 ENCOUNTER — Telehealth: Payer: 59 | Admitting: Nurse Practitioner

## 2020-10-27 ENCOUNTER — Other Ambulatory Visit: Payer: Self-pay

## 2020-10-27 ENCOUNTER — Encounter: Payer: Self-pay | Admitting: Nurse Practitioner

## 2020-10-27 DIAGNOSIS — J069 Acute upper respiratory infection, unspecified: Secondary | ICD-10-CM

## 2020-10-27 LAB — POCT RAPID STREP A (OFFICE): Rapid Strep A Screen: NEGATIVE

## 2020-10-27 LAB — POCT INFLUENZA A/B
Influenza A, POC: NEGATIVE
Influenza B, POC: NEGATIVE

## 2020-10-27 MED ORDER — FLUTICASONE PROPIONATE 50 MCG/ACT NA SUSP: NASAL | 6 refills | Status: DC

## 2020-10-27 MED ORDER — NOREL AD 4-10-325 MG PO TABS
1.0000 | ORAL_TABLET | ORAL | 1 refills | Status: DC | PRN
  Filled 2020-10-27: qty 20, 4d supply, fill #0

## 2020-10-27 MED ORDER — FLUTICASONE PROPIONATE 50 MCG/ACT NA SUSP
NASAL | 6 refills | Status: DC
  Filled 2020-10-27: qty 16, 30d supply, fill #0

## 2020-10-27 MED ORDER — NOREL AD 4-10-325 MG PO TABS: 1.0000 | ORAL_TABLET | ORAL | 1 refills | Status: DC | PRN

## 2020-10-27 MED FILL — Sertraline HCl Tab 50 MG: ORAL | 30 days supply | Qty: 30 | Fill #0 | Status: AC

## 2020-10-27 MED FILL — Buspirone HCl Tab 10 MG: ORAL | 60 days supply | Qty: 90 | Fill #0 | Status: AC

## 2020-10-27 MED FILL — Omeprazole Cap Delayed Release 40 MG: ORAL | 60 days supply | Qty: 60 | Fill #0 | Status: AC

## 2020-10-27 NOTE — Addendum Note (Signed)
Addended by: Bjorn Pippin on: 10/27/2020 01:35 PM   Modules accepted: Orders

## 2020-10-27 NOTE — Progress Notes (Signed)
Acute Office Visit  Subjective:    Patient ID: Kim [name], female    DOB: 10-06-1995, 25 y.o.   MRN: [id number]  Chief Complaint  Patient presents with  . Cough    X2 days  . Headache    X2 days  . Sore Throat    x2 days     HPI Patient is in today for acute visit. She is having dry cough, headache, congestion, and chest pain from coughing.  Her symptoms started 2 days ago.  She has not had a COVID test.  Past Medical History:  Diagnosis Date  . ADHD (attention deficit hyperactivity disorder)   . Anxiety   . Depression   . Encounter for gynecological examination with Papanicolaou smear of cervix 03/27/2017  . Encounter for surveillance of contraceptive pills 03/27/2017  . Migraines     No past surgical history on file.  Family History  Problem Relation Age of Onset  . Cancer Paternal Grandfather   . Alzheimer's disease Paternal Grandmother   . Cancer Maternal Grandmother        breast   . Cancer Maternal Grandfather        pancreatic and bone  . Cancer Father        lung  . Hypertension Mother     Social History   Socioeconomic History  . Marital status: Single    Spouse name: Not on file  . Number of children: Not on file  . Years of education: Not on file  . Highest education level: Associate degree: occupational, Scientist, product/process development, or vocational program  Occupational History  . Not on file  Tobacco Use  . Smoking status: Never Smoker  . Smokeless tobacco: Never Used  Vaping Use  . Vaping Use: Every day  Substance and Sexual Activity  . Alcohol use: Yes    Comment: monthly  . Drug use: No  . Sexual activity: Yes    Birth control/protection: I.U.D.  Other Topics Concern  . Not on file  Social History Narrative   Lives with mom, step dad      Pets: 3 dogs: Skimp, Ginger, and Evie May      Enjoys: read-genre: poetry, movies and music      Diet: eats all food groups, snacks with stress   Caffeine: coffee-2 cups; tea daily, mountain dew     Water: 1-2 cups daily       Does not wear seat belt due to being anxiety    Does not use phone while driving   Psychologist, sport and exercise at home    Social Determinants of Health   Financial Resource Strain: Not on file  Food Insecurity: Not on file  Transportation Needs: Not on file  Physical Activity: Not on file  Stress: Not on file  Social Connections: Not on file  Intimate Partner Violence: Not on file    Outpatient Medications Prior to Visit  Medication Sig Dispense Refill  . acetaminophen (TYLENOL) 500 MG tablet Take 500 mg by mouth every 6 (six) hours as needed.    . busPIRone (BUSPAR) 10 MG tablet Take 0.5 tablets (5 mg total) by mouth 3 (three) times daily. 90 tablet 1  . busPIRone (BUSPAR) 10 MG tablet TAKE 1/2 TABLET BY MOUTH 3 TIMES DAILY 135 tablet 0  . omeprazole (PRILOSEC) 40 MG capsule TAKE 1 CAPSULE BY MOUTH ONCE DAILY ON AN EMPTY STOMACH 1 HOUR PRIOR TO EATING OR DRINKING. 90 capsule 0  . sertraline (ZOLOFT) 50 MG tablet Take 1  tablet (50 mg total) by mouth daily. 30 tablet 2  . sertraline (ZOLOFT) 50 MG tablet TAKE 1 TABLET BY MOUTH ONCE DAILY 60 tablet 0  . butalbital-acetaminophen-caffeine (FIORICET, ESGIC) 50-325-40 MG tablet Take 1-2 tablets by mouth every 4 (four) hours as needed for headache. Max 6 caps/day (Patient not taking: Reported on 08/25/2020) 20 tablet 0  . hydrOXYzine (ATARAX/VISTARIL) 25 MG tablet Take 1 tablet (25 mg total) by mouth at bedtime. (Patient not taking: Reported on 08/25/2020) 30 tablet 0  . omeprazole (PRILOSEC) 40 MG capsule Take 1 capsule (40 mg total) by mouth daily. Take on empty stomach 1 hour prior to eating or drinking (Patient not taking: Reported on 08/25/2020) 30 capsule 3   No facility-administered medications prior to visit.    Allergies  Allergen Reactions  . Amoxicillin Rash  . Penicillins Rash  . Amoxicillin   . Doxycycline Hives  . Dicyclomine Rash  . Latex Itching    Review of Systems  Constitutional: Positive for  fatigue and fever. Negative for chills.  HENT: Positive for congestion, ear pain, rhinorrhea, sinus pressure, sinus pain and sore throat.   Respiratory: Positive for cough. Negative for chest tightness and wheezing.   Cardiovascular: Negative.        Objective:    Physical Exam  There were no vitals taken for this visit. Wt Readings from Last 3 Encounters:  07/23/20 183 lb (83 kg)  07/12/20 182 lb 15.7 oz (83 kg)  06/24/20 184 lb (83.5 kg)    Health Maintenance Due  Topic Date Due  . Hepatitis C Screening  Never done  . HPV VACCINES (1 - 2-dose series) Never done  . HIV Screening  Never done  . CHLAMYDIA SCREENING  03/27/2018  . PAP-Cervical Cytology Screening  03/27/2020  . PAP SMEAR-Modifier  03/27/2020       Topic Date Due  . HPV VACCINES (1 - 2-dose series) Never done     Lab Results  Component Value Date   TSH 1.86 11/20/2019   Lab Results  Component Value Date   WBC 6.1 08/25/2020   HGB 12.6 08/25/2020   HCT 37.5 08/25/2020   MCV 92.8 08/25/2020   PLT 217 08/25/2020   Lab Results  Component Value Date   NA 135 08/25/2020   K 3.8 08/25/2020   CO2 23 08/25/2020   GLUCOSE 92 08/25/2020   BUN 7 08/25/2020   CREATININE 0.51 08/25/2020   BILITOT 1.0 11/20/2019   ALKPHOS 53 04/28/2018   AST 13 11/20/2019   ALT 8 11/20/2019   PROT 7.8 11/20/2019   ALBUMIN 4.2 04/28/2018   CALCIUM 9.0 08/25/2020   ANIONGAP 5 08/25/2020   No results found for: CHOL No results found for: HDL No results found for: LDLCALC No results found for: TRIG No results found for: CHOLHDL No results found for: IRJJ8A     Assessment & Plan:   Problem List Items Addressed This Visit      Respiratory   URI (upper respiratory infection)    -with sore throat and other symptoms will swab for strep and flu; recommend COVID testing as well -Rx. norel -Rx. flonase -if strep or flu are positive, will call in prescriptions          Meds ordered this encounter  Medications   . fluticasone (FLONASE) 50 MCG/ACT nasal spray    Sig: Use 2 sprays in each nostril BID for a week. After 1 week, decrease to 1 spray in each nostril BID as needed  for congestion/allergies.    Dispense:  16 g    Refill:  6  . Chlorphen-PE-Acetaminophen (NOREL AD) 4-10-325 MG TABS    Sig: Take 1 tablet by mouth every 4 (four) hours as needed (nasal congestion, cold symptoms).    Dispense:  20 tablet    Refill:  1   Date:  10/27/2020   Location of Patient: Home Location of Provider: Office Consent was obtain for visit to be over via telehealth. I verified that I am speaking with the correct person using two identifiers.  I connected with  Beverely Low on 10/27/20 via telephone and verified that I am speaking with the correct person using two identifiers.   I discussed the limitations of evaluation and management by telemedicine. The patient expressed understanding and agreed to proceed.  Time spent: 9 minutes   Heather Roberts, NP

## 2020-10-27 NOTE — Assessment & Plan Note (Signed)
-  with sore throat and other symptoms will swab for strep and flu; recommend COVID testing as well -Rx. norel -Rx. flonase -if strep or flu are positive, will call in prescriptions

## 2020-10-27 NOTE — Addendum Note (Signed)
Addended by: Jerilynn Mages on: 10/27/2020 01:58 PM   Modules accepted: Orders

## 2020-10-28 ENCOUNTER — Other Ambulatory Visit (HOSPITAL_COMMUNITY): Payer: Self-pay

## 2020-11-02 ENCOUNTER — Ambulatory Visit (INDEPENDENT_AMBULATORY_CARE_PROVIDER_SITE_OTHER): Payer: 59 | Admitting: Professional

## 2020-11-02 DIAGNOSIS — F411 Generalized anxiety disorder: Secondary | ICD-10-CM

## 2020-11-02 DIAGNOSIS — F4323 Adjustment disorder with mixed anxiety and depressed mood: Secondary | ICD-10-CM | POA: Diagnosis not present

## 2020-11-05 ENCOUNTER — Encounter: Payer: Self-pay | Admitting: Internal Medicine

## 2020-11-05 ENCOUNTER — Ambulatory Visit (INDEPENDENT_AMBULATORY_CARE_PROVIDER_SITE_OTHER): Payer: 59 | Admitting: Internal Medicine

## 2020-11-05 ENCOUNTER — Other Ambulatory Visit: Payer: Self-pay

## 2020-11-05 VITALS — BP 138/82 | HR 85 | Resp 18 | Ht 71.0 in | Wt 188.4 lb

## 2020-11-05 DIAGNOSIS — Z1159 Encounter for screening for other viral diseases: Secondary | ICD-10-CM

## 2020-11-05 DIAGNOSIS — Z0000 Encounter for general adult medical examination without abnormal findings: Secondary | ICD-10-CM

## 2020-11-05 DIAGNOSIS — Z124 Encounter for screening for malignant neoplasm of cervix: Secondary | ICD-10-CM

## 2020-11-05 DIAGNOSIS — F332 Major depressive disorder, recurrent severe without psychotic features: Secondary | ICD-10-CM | POA: Diagnosis not present

## 2020-11-05 DIAGNOSIS — Z118 Encounter for screening for other infectious and parasitic diseases: Secondary | ICD-10-CM

## 2020-11-05 DIAGNOSIS — Z114 Encounter for screening for human immunodeficiency virus [HIV]: Secondary | ICD-10-CM | POA: Diagnosis not present

## 2020-11-05 DIAGNOSIS — R030 Elevated blood-pressure reading, without diagnosis of hypertension: Secondary | ICD-10-CM | POA: Diagnosis not present

## 2020-11-05 DIAGNOSIS — F411 Generalized anxiety disorder: Secondary | ICD-10-CM | POA: Diagnosis not present

## 2020-11-05 NOTE — Assessment & Plan Note (Signed)
Better with Zoloft now

## 2020-11-05 NOTE — Progress Notes (Signed)
Established Patient Office Visit  Subjective:  Patient ID: Kim [name], female    DOB: 1996-06-26  Age: 25 y.o. MRN: [id number]  CC:  Chief Complaint  Patient presents with  . Follow-up    Follow up pt chest still hurting went to er in feb said it was anxiety     HPI Kim [name] is a 25 year old female with PMH of anxiety and depression who presents for follow up of depression.  She had an ER visit in 08/2020 for chest wall pain. EKG was unremarkable at that time. She occasionally has chest tightness, but denies it currently. She denies any dyspnea or palpitations currently. Her BP is borderline high.  She has been doing well with Zoloft and Buspirone now. Denies any SI or HI.  Past Medical History:  Diagnosis Date  . ADHD (attention deficit hyperactivity disorder)   . Anxiety   . Depression   . Encounter for gynecological examination with Papanicolaou smear of cervix 03/27/2017  . Encounter for surveillance of contraceptive pills 03/27/2017  . Migraines     History reviewed. No pertinent surgical history.  Family History  Problem Relation Age of Onset  . Cancer Paternal Grandfather   . Alzheimer's disease Paternal Grandmother   . Cancer Maternal Grandmother        breast   . Cancer Maternal Grandfather        pancreatic and bone  . Cancer Father        lung  . Hypertension Mother     Social History   Socioeconomic History  . Marital status: Single    Spouse name: Not on file  . Number of children: Not on file  . Years of education: Not on file  . Highest education level: Associate degree: occupational, Scientist, product/process development, or vocational program  Occupational History  . Not on file  Tobacco Use  . Smoking status: Never Smoker  . Smokeless tobacco: Never Used  Vaping Use  . Vaping Use: Every day  Substance and Sexual Activity  . Alcohol use: Yes    Comment: monthly  . Drug use: No  . Sexual activity: Yes    Birth control/protection: I.U.D.  Other  Topics Concern  . Not on file  Social History Narrative   Lives with mom, step dad      Pets: 3 dogs: Skimp, Ginger, and Evie May      Enjoys: read-genre: poetry, movies and music      Diet: eats all food groups, snacks with stress   Caffeine: coffee-2 cups; tea daily, mountain dew    Water: 1-2 cups daily       Does not wear seat belt due to being anxiety    Does not use phone while driving   Psychologist, sport and exercise at home    Social Determinants of Health   Financial Resource Strain: Not on file  Food Insecurity: Not on file  Transportation Needs: Not on file  Physical Activity: Not on file  Stress: Not on file  Social Connections: Not on file  Intimate Partner Violence: Not on file    Outpatient Medications Prior to Visit  Medication Sig Dispense Refill  . acetaminophen (TYLENOL) 500 MG tablet Take 500 mg by mouth every 6 (six) hours as needed.    . busPIRone (BUSPAR) 10 MG tablet Take 0.5 tablets (5 mg total) by mouth 3 (three) times daily. 90 tablet 1  . Chlorphen-PE-Acetaminophen (NOREL AD) 4-10-325 MG TABS Take 1 tablet by mouth every 4 (four) hours  as needed (nasal congestion, cold symptoms). 20 tablet 1  . fluticasone (FLONASE) 50 MCG/ACT nasal spray Use 2 sprays in each nostril twice daily for a week. After 1 week, decrease to 1 spray in each nostril twice daily  as needed for congestion/allergies. 16 g 6  . omeprazole (PRILOSEC) 40 MG capsule TAKE 1 CAPSULE BY MOUTH ONCE DAILY ON AN EMPTY STOMACH 1 HOUR PRIOR TO EATING OR DRINKING. 90 capsule 0  . sertraline (ZOLOFT) 50 MG tablet Take 1 tablet (50 mg total) by mouth daily. 30 tablet 2  . busPIRone (BUSPAR) 10 MG tablet TAKE 1/2 TABLET BY MOUTH 3 TIMES DAILY 135 tablet 0  . sertraline (ZOLOFT) 50 MG tablet TAKE 1 TABLET BY MOUTH ONCE DAILY (Patient not taking: Reported on 11/05/2020) 60 tablet 0   No facility-administered medications prior to visit.    Allergies  Allergen Reactions  . Amoxicillin Rash  . Penicillins Rash   . Amoxicillin   . Doxycycline Hives  . Dicyclomine Rash  . Latex Itching    ROS Review of Systems  Constitutional: Negative for chills and fever.  HENT: Negative for congestion, sinus pressure, sinus pain and sore throat.   Eyes: Negative for pain and discharge.  Respiratory: Negative for cough and shortness of breath.   Cardiovascular: Negative for chest pain and palpitations.  Gastrointestinal: Positive for diarrhea. Negative for abdominal pain, constipation, nausea and vomiting.  Endocrine: Negative for polydipsia and polyuria.  Genitourinary: Negative for dysuria and hematuria.  Musculoskeletal: Negative for neck pain and neck stiffness.  Skin: Negative for rash.  Neurological: Negative for dizziness and weakness.  Psychiatric/Behavioral: Negative for agitation and behavioral problems.      Objective:    Physical Exam Vitals reviewed.  Constitutional:      General: She is not in acute distress.    Appearance: She is not diaphoretic.  HENT:     Head: Normocephalic and atraumatic.     Nose: No congestion.  Eyes:     General: No scleral icterus.    Extraocular Movements: Extraocular movements intact.  Cardiovascular:     Rate and Rhythm: Normal rate and regular rhythm.     Pulses: Normal pulses.     Heart sounds: Normal heart sounds. No murmur heard.   Pulmonary:     Breath sounds: Normal breath sounds. No wheezing or rales.  Musculoskeletal:     Cervical back: Neck supple. No tenderness.     Right lower leg: No edema.     Left lower leg: No edema.  Skin:    General: Skin is warm.     Findings: No rash.  Neurological:     General: No focal deficit present.     Mental Status: She is alert and oriented to person, place, and time.  Psychiatric:        Mood and Affect: Mood normal.        Behavior: Behavior normal.     BP 138/82 (BP Location: Right Arm, Cuff Size: Normal)   Pulse 85   Resp 18   Ht 5\' 11"  (1.803 m)   Wt 188 lb 6.4 oz (85.5 kg)   SpO2 98%    BMI 26.28 kg/m  Wt Readings from Last 3 Encounters:  11/05/20 188 lb 6.4 oz (85.5 kg)  07/23/20 183 lb (83 kg)  07/12/20 182 lb 15.7 oz (83 kg)     Health Maintenance Due  Topic Date Due  . Hepatitis C Screening  Never done  . HPV VACCINES (1 -  2-dose series) Never done  . CHLAMYDIA SCREENING  03/27/2018  . PAP-Cervical Cytology Screening  03/27/2020  . PAP SMEAR-Modifier  03/27/2020  . COVID-19 Vaccine (3 - Booster for Pfizer series) 10/16/2020       Topic Date Due  . HPV VACCINES (1 - 2-dose series) Never done    Lab Results  Component Value Date   TSH 1.86 11/20/2019   Lab Results  Component Value Date   WBC 6.1 08/25/2020   HGB 12.6 08/25/2020   HCT 37.5 08/25/2020   MCV 92.8 08/25/2020   PLT 217 08/25/2020   Lab Results  Component Value Date   NA 135 08/25/2020   K 3.8 08/25/2020   CO2 23 08/25/2020   GLUCOSE 92 08/25/2020   BUN 7 08/25/2020   CREATININE 0.51 08/25/2020   BILITOT 1.0 11/20/2019   ALKPHOS 53 04/28/2018   AST 13 11/20/2019   ALT 8 11/20/2019   PROT 7.8 11/20/2019   ALBUMIN 4.2 04/28/2018   CALCIUM 9.0 08/25/2020   ANIONGAP 5 08/25/2020   No results found for: CHOL No results found for: HDL No results found for: LDLCALC No results found for: TRIG No results found for: CHOLHDL No results found for: JIRC7E    Assessment & Plan:   Problem List Items Addressed This Visit      Other   Severe episode of recurrent major depressive disorder, without psychotic features (HCC)    Better with Zoloft now      GAD (generalized anxiety disorder) - Primary    Overall well-controlled with Buspar with occasional anxiety spells Chest wall pain could be related to anxiety vs costochondritis in the past.  Prehypertension BP Readings from Last 1 Encounters:  11/05/20 138/82   Advised DASH diet and moderate exercise/walking, at least 150 mins/week       Other Visit Diagnoses    Routine cervical smear       Relevant Orders    Ambulatory referral to Obstetrics / Gynecology   Need for hepatitis C screening test       Relevant Orders   Hepatitis C Antibody   Encounter for screening for HIV       Relevant Orders   HIV antibody (with reflex)   Screening for chlamydial disease       Relevant Orders   Chlamydia/GC NAA, Confirmation    No orders of the defined types were placed in this encounter.   Follow-up: Return in about 3 months (around 02/04/2021) for Annual physical.    Anabel Halon, MD

## 2020-11-05 NOTE — Patient Instructions (Addendum)
Please continue taking medications as prescribed.  Please follow low salt diet and continue to perform moderate exercise/walking at least 150 mins/week.  Please get fasting blood tests done before the next visit.

## 2020-11-05 NOTE — Assessment & Plan Note (Signed)
Overall well-controlled with Buspar with occasional anxiety spells Chest wall pain could be related to anxiety vs costochondritis in the past.

## 2020-11-07 LAB — CHLAMYDIA/GC NAA, CONFIRMATION
Chlamydia trachomatis, NAA: NEGATIVE
Neisseria gonorrhoeae, NAA: NEGATIVE

## 2020-11-16 ENCOUNTER — Ambulatory Visit: Payer: 59 | Admitting: Professional

## 2020-11-17 ENCOUNTER — Other Ambulatory Visit: Payer: Self-pay

## 2020-11-17 ENCOUNTER — Encounter (INDEPENDENT_AMBULATORY_CARE_PROVIDER_SITE_OTHER): Payer: Self-pay | Admitting: *Deleted

## 2020-11-17 ENCOUNTER — Other Ambulatory Visit (HOSPITAL_COMMUNITY): Payer: Self-pay

## 2020-11-17 ENCOUNTER — Ambulatory Visit (INDEPENDENT_AMBULATORY_CARE_PROVIDER_SITE_OTHER): Payer: 59 | Admitting: Internal Medicine

## 2020-11-17 ENCOUNTER — Encounter (INDEPENDENT_AMBULATORY_CARE_PROVIDER_SITE_OTHER): Payer: Self-pay | Admitting: Internal Medicine

## 2020-11-17 ENCOUNTER — Other Ambulatory Visit (INDEPENDENT_AMBULATORY_CARE_PROVIDER_SITE_OTHER): Payer: Self-pay | Admitting: Internal Medicine

## 2020-11-17 VITALS — BP 133/87 | HR 101 | Temp 99.2°F | Ht 71.0 in | Wt 186.1 lb

## 2020-11-17 DIAGNOSIS — R112 Nausea with vomiting, unspecified: Secondary | ICD-10-CM | POA: Insufficient documentation

## 2020-11-17 DIAGNOSIS — R197 Diarrhea, unspecified: Secondary | ICD-10-CM | POA: Diagnosis not present

## 2020-11-17 DIAGNOSIS — R109 Unspecified abdominal pain: Secondary | ICD-10-CM | POA: Diagnosis not present

## 2020-11-17 MED ORDER — HYOSCYAMINE SULFATE SL 0.125 MG SL SUBL
1.0000 | SUBLINGUAL_TABLET | Freq: Three times a day (TID) | SUBLINGUAL | 1 refills | Status: DC
  Filled 2020-11-17: qty 90, 30d supply, fill #0

## 2020-11-17 NOTE — Progress Notes (Signed)
Reason for consultation  Abdominal pain nausea and vomiting.  History of present illness  Kim [name] is 25 year old Caucasian female who is referred through courtesy of Ms. Cherly Beach, NP for GI evaluation. Patient states her present symptoms began in November 2021.  She has been experiencing epigastric, right upper quadrant as well as right lower quadrant abdominal pain associated with nausea vomiting.  She she does not have good appetite.  She says her weight 1 year ago was 211 pounds.  She has lost 25 pounds in last 6 months.  Pain lately has been occurring daily.  It can last for 30 to 45 minutes.  Pain is described to be sharp and cutting pain.  Pain also radiates into her left scapular region.  This pain is triggered by salads and meats.  She has postprandial nausea vomiting occurring couple of times a week.  Vomiting occurs within 20 to 30 minutes for meal.  Last time she vomited was 2 days ago.  She is able to picturing a phone and vomited food debris.  No history of hematemesis melena or rectal bleeding.  She says she has been having 3-4 soft stools since her symptoms began.  She denies dysuria or hematuria but her urine has been dark at times.  Her periods have been regular.  She has experienced chills lightheadedness and rash over neck and chest when she has pain but she has not had fever or night sweats. She also complains of daily heartburn in spite of taking omeprazole which she has been taking since December 2021. She says she takes 1-2 Advils almost daily for abdominal pain. Patient says she drinks beer socially but not daily.  She may drink 4 to 6 cans of beer per week but not every week.  Patient was seen in emergency room on 08/25/2020 for chest pain and dizziness.  2 troponin levels were normal.  Chest x-ray was unremarkable.  She had CBC and metabolic 7 but did not have LFTs.   Current Medications: Outpatient Encounter Medications as of 11/17/2020  Medication Sig  . acetaminophen  (TYLENOL) 500 MG tablet Take 500 mg by mouth every 6 (six) hours as needed.  . busPIRone (BUSPAR) 10 MG tablet Take 0.5 tablets (5 mg total) by mouth 3 (three) times daily.  . Chlorphen-PE-Acetaminophen (NOREL AD) 4-10-325 MG TABS Take 1 tablet by mouth every 4 (four) hours as needed (nasal congestion, cold symptoms).  . fluticasone (FLONASE) 50 MCG/ACT nasal spray Use 2 sprays in each nostril twice daily for a week. After 1 week, decrease to 1 spray in each nostril twice daily  as needed for congestion/allergies.  Marland Kitchen omeprazole (PRILOSEC) 40 MG capsule TAKE 1 CAPSULE BY MOUTH ONCE DAILY ON AN EMPTY STOMACH 1 HOUR PRIOR TO EATING OR DRINKING.  . sertraline (ZOLOFT) 50 MG tablet Take 1 tablet (50 mg total) by mouth daily.   No facility-administered encounter medications on file as of 11/17/2020.   Past medical history  Depression Anxiety She received hepatitis A and B vaccination while in middle school. She has received 2 doses of COVID-vaccine. Fracture to left second toe in July 2021.   Allergies  Allergies  Allergen Reactions  . Amoxicillin Rash  . Penicillins Rash  . Amoxicillin   . Doxycycline Hives  . Dicyclomine Rash  . Latex Itching    Family history  Mother has hypertension.  She is 44 years old. Father died of lung carcinoma within 2 weeks of diagnosis in his early 35s. She has sister age  35 in good health.  Social history  She is single.  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  She has never smoked cigarettes.  She drinks beer socially but not daily.  She states she drinks no more than 4 to 6 cans/week.  Problem list mentions binge drinking.  Physical examination  Blood pressure 133/87, pulse (!) 101, temperature 99.2 F (37.3 C), temperature source Oral, height 5' 11" (1.803 m), weight 186 lb 1.6 oz (84.4 kg). Patient is alert and in no acute distress. Conjunctiva is pink. Sclera is nonicteric Oropharyngeal mucosa is normal. No neck masses or  thyromegaly noted. Cardiac exam with regular rhythm normal S1 and S2. No murmur or gallop noted. Lungs are clear to auscultation. Abdomen is symmetrical.  She has belly ring in place.  Bowel sounds are normal.  On palpation abdomen is soft.  She has mild tenderness in epigastric region, right upper and lower quadrants without guarding.  No organomegaly or masses. No LE edema or clubbing noted. She has a tattoo over her right and left forearm.  Labs/studies Results:   CBC Latest Ref Rng & Units 08/25/2020 11/20/2019 04/28/2018  WBC 4.0 - 10.5 K/uL 6.1 8.1 8.7  Hemoglobin 12.0 - 15.0 g/dL 12.6 14.0 12.3  Hematocrit 36.0 - 46.0 % 37.5 41.0 37.8  Platelets 150 - 400 K/uL 217 251 236    CMP Latest Ref Rng & Units 08/25/2020 11/20/2019 04/28/2018  Glucose 70 - 99 mg/dL 92 81 105(H)  BUN 6 - 20 mg/dL 7 10 9  Creatinine 0.44 - 1.00 mg/dL 0.51 0.69 0.62  Sodium 135 - 145 mmol/L 135 138 138  Potassium 3.5 - 5.1 mmol/L 3.8 4.3 3.7  Chloride 98 - 111 mmol/L 107 102 108  CO2 22 - 32 mmol/L 23 28 25  Calcium 8.9 - 10.3 mg/dL 9.0 10.0 8.8(L)  Total Protein 6.1 - 8.1 g/dL - 7.8 7.4  Total Bilirubin 0.2 - 1.2 mg/dL - 1.0 0.6  Alkaline Phos 38 - 126 U/L - - 53  AST 10 - 30 U/L - 13 14(L)  ALT 6 - 29 U/L - 8 11    Hepatic Function Latest Ref Rng & Units 11/20/2019 04/28/2018 06/22/2015  Total Protein 6.1 - 8.1 g/dL 7.8 7.4 7.5  Albumin 3.5 - 5.0 g/dL - 4.2 4.5  AST 10 - 30 U/L 13 14(L) 14  ALT 6 - 29 U/L 8 11 9  Alk Phosphatase 38 - 126 U/L - 53 57  Total Bilirubin 0.2 - 1.2 mg/dL 1.0 0.6 0.6     Assessment:    Patient is 25-year-old Caucasian female who presents with over 6-month history of abdominal pain associated with nausea vomiting and diarrhea.  She also has lost 25 pounds. She was seen in emergency room in February 2022 for chest pain but did not have LFTs.  She has not responded to PPI.  He does take ibuprofen almost daily.  She could have peptic ulcer disease since she has been taking  ibuprofen almost daily for a year.  However it would be difficult to explain all of her symptoms based on this diagnosis unless she has IBS.  She could have cholelithiasis and we also need to worry about inflammatory bowel disease. We will proceed with lab studies as outlined below along with abdominal ultrasound.   Plan:  Hemoccult x1. CBC with differential, comprehensive chemistry panel, CRP, sed rate and stool H. pylori antigen. Urinalysis. Patient advised to keep alcohol intake to no more than 1   drink per day if she chooses to drink. Abdominal ultrasound. Hyoscyamine sublingual 1 tablet before each meal. Further recommendations to follow.      

## 2020-11-17 NOTE — Patient Instructions (Signed)
Hemoccult x1. Physician will call with results of blood work and ultrasound when completed.

## 2020-11-18 ENCOUNTER — Other Ambulatory Visit (HOSPITAL_COMMUNITY): Payer: Self-pay

## 2020-11-18 LAB — CBC WITH DIFFERENTIAL/PLATELET
Absolute Monocytes: 353 cells/uL (ref 200–950)
Basophils Absolute: 28 cells/uL (ref 0–200)
Basophils Relative: 0.5 %
Eosinophils Absolute: 50 cells/uL (ref 15–500)
Eosinophils Relative: 0.9 %
HCT: 40.6 % (ref 35.0–45.0)
Hemoglobin: 13.4 g/dL (ref 11.7–15.5)
Lymphs Abs: 1926 cells/uL (ref 850–3900)
MCH: 30.9 pg (ref 27.0–33.0)
MCHC: 33 g/dL (ref 32.0–36.0)
MCV: 93.5 fL (ref 80.0–100.0)
MPV: 10 fL (ref 7.5–12.5)
Monocytes Relative: 6.3 %
Neutro Abs: 3242 cells/uL (ref 1500–7800)
Neutrophils Relative %: 57.9 %
Platelets: 230 10*3/uL (ref 140–400)
RBC: 4.34 10*6/uL (ref 3.80–5.10)
RDW: 11.8 % (ref 11.0–15.0)
Total Lymphocyte: 34.4 %
WBC: 5.6 10*3/uL (ref 3.8–10.8)

## 2020-11-18 LAB — URINALYSIS
Bilirubin Urine: NEGATIVE
Glucose, UA: NEGATIVE
Hgb urine dipstick: NEGATIVE
Ketones, ur: NEGATIVE
Leukocytes,Ua: NEGATIVE
Nitrite: NEGATIVE
Protein, ur: NEGATIVE
Specific Gravity, Urine: 1.019 (ref 1.001–1.035)
pH: 7.5 (ref 5.0–8.0)

## 2020-11-18 LAB — COMPREHENSIVE METABOLIC PANEL
AG Ratio: 1.8 (calc) (ref 1.0–2.5)
ALT: 8 U/L (ref 6–29)
AST: 13 U/L (ref 10–30)
Albumin: 4.7 g/dL (ref 3.6–5.1)
Alkaline phosphatase (APISO): 49 U/L (ref 31–125)
BUN: 7 mg/dL (ref 7–25)
CO2: 28 mmol/L (ref 20–32)
Calcium: 9.4 mg/dL (ref 8.6–10.2)
Chloride: 106 mmol/L (ref 98–110)
Creat: 0.69 mg/dL (ref 0.50–1.10)
Globulin: 2.6 g/dL (calc) (ref 1.9–3.7)
Glucose, Bld: 79 mg/dL (ref 65–99)
Potassium: 4 mmol/L (ref 3.5–5.3)
Sodium: 138 mmol/L (ref 135–146)
Total Bilirubin: 0.5 mg/dL (ref 0.2–1.2)
Total Protein: 7.3 g/dL (ref 6.1–8.1)

## 2020-11-18 LAB — SEDIMENTATION RATE: Sed Rate: 2 mm/h (ref 0–20)

## 2020-11-18 LAB — C-REACTIVE PROTEIN: CRP: 0.3 mg/L (ref <8.0)

## 2020-11-24 ENCOUNTER — Other Ambulatory Visit (HOSPITAL_COMMUNITY): Payer: Self-pay

## 2020-11-24 MED ORDER — HYOSCYAMINE SULFATE SL 0.125 MG SL SUBL
1.0000 | SUBLINGUAL_TABLET | Freq: Three times a day (TID) | SUBLINGUAL | 1 refills | Status: DC
  Filled 2020-11-24 – 2020-12-11 (×2): qty 90, 30d supply, fill #0

## 2020-11-28 ENCOUNTER — Other Ambulatory Visit (HOSPITAL_COMMUNITY): Payer: Self-pay

## 2020-11-30 ENCOUNTER — Ambulatory Visit: Payer: 59 | Admitting: Professional

## 2020-12-01 ENCOUNTER — Ambulatory Visit (HOSPITAL_COMMUNITY)
Admission: RE | Admit: 2020-12-01 | Discharge: 2020-12-01 | Disposition: A | Discharge status: Home / Self Care | Payer: 59 | Source: Ambulatory Visit | Attending: Internal Medicine | Admitting: Internal Medicine

## 2020-12-01 ENCOUNTER — Other Ambulatory Visit (INDEPENDENT_AMBULATORY_CARE_PROVIDER_SITE_OTHER): Payer: Self-pay | Admitting: Internal Medicine

## 2020-12-01 DIAGNOSIS — R112 Nausea with vomiting, unspecified: Secondary | ICD-10-CM | POA: Diagnosis not present

## 2020-12-01 DIAGNOSIS — R1013 Epigastric pain: Secondary | ICD-10-CM | POA: Diagnosis not present

## 2020-12-01 DIAGNOSIS — R109 Unspecified abdominal pain: Secondary | ICD-10-CM | POA: Insufficient documentation

## 2020-12-01 DIAGNOSIS — R197 Diarrhea, unspecified: Secondary | ICD-10-CM

## 2020-12-02 ENCOUNTER — Other Ambulatory Visit (INDEPENDENT_AMBULATORY_CARE_PROVIDER_SITE_OTHER): Payer: Self-pay

## 2020-12-02 DIAGNOSIS — R197 Diarrhea, unspecified: Secondary | ICD-10-CM | POA: Diagnosis not present

## 2020-12-02 DIAGNOSIS — R112 Nausea with vomiting, unspecified: Secondary | ICD-10-CM | POA: Diagnosis not present

## 2020-12-02 DIAGNOSIS — R5383 Other fatigue: Secondary | ICD-10-CM

## 2020-12-02 DIAGNOSIS — R109 Unspecified abdominal pain: Secondary | ICD-10-CM | POA: Diagnosis not present

## 2020-12-02 DIAGNOSIS — R21 Rash and other nonspecific skin eruption: Secondary | ICD-10-CM

## 2020-12-03 ENCOUNTER — Other Ambulatory Visit (INDEPENDENT_AMBULATORY_CARE_PROVIDER_SITE_OTHER): Payer: Self-pay

## 2020-12-03 ENCOUNTER — Telehealth (INDEPENDENT_AMBULATORY_CARE_PROVIDER_SITE_OTHER): Payer: Self-pay

## 2020-12-03 DIAGNOSIS — R197 Diarrhea, unspecified: Secondary | ICD-10-CM

## 2020-12-03 NOTE — Telephone Encounter (Signed)
   Diagnosis:    Result(s)   Card 1: Positive:  Negative: Negative     Card 2: Positive:  Negative: Negative   Card 3: Positive:  Negative:    Completed by:    HEMOCCULT SENSA DEVELOPER: LOT#:  [id number] EXPIRATION DATE: 12/2020  HEMOCCULT SENSA CARD:  LOT#: [id number] EXPIRATION DATE: 01/2021   CARD CONTROL RESULTS:  POSITIVE: Postive NEGATIVE: Negative    ADDITIONAL COMMENTS:

## 2020-12-03 NOTE — Telephone Encounter (Signed)
Results noted. Stool guaiac negative.

## 2020-12-05 ENCOUNTER — Ambulatory Visit: Payer: 59 | Admitting: Professional

## 2020-12-08 LAB — CBC WITH DIFFERENTIAL/PLATELET
Absolute Monocytes: 403 cells/uL (ref 200–950)
Basophils Absolute: 50 cells/uL (ref 0–200)
Basophils Relative: 0.8 %
Eosinophils Absolute: 81 cells/uL (ref 15–500)
Eosinophils Relative: 1.3 %
HCT: 39 % (ref 35.0–45.0)
Hemoglobin: 13.2 g/dL (ref 11.7–15.5)
Lymphs Abs: 2251 cells/uL (ref 850–3900)
MCH: 31.3 pg (ref 27.0–33.0)
MCHC: 33.8 g/dL (ref 32.0–36.0)
MCV: 92.4 fL (ref 80.0–100.0)
MPV: 9.8 fL (ref 7.5–12.5)
Monocytes Relative: 6.5 %
Neutro Abs: 3416 cells/uL (ref 1500–7800)
Neutrophils Relative %: 55.1 %
Platelets: 226 10*3/uL (ref 140–400)
RBC: 4.22 10*6/uL (ref 3.80–5.10)
RDW: 12.2 % (ref 11.0–15.0)
Total Lymphocyte: 36.3 %
WBC: 6.2 10*3/uL (ref 3.8–10.8)

## 2020-12-08 LAB — ALPHA-GAL PANEL
Beef IgE: <0.1 kU/L (ref <0.35)
Class: 0
Class: 0
Class: 0
Galactose-alpha-1,3-galactose IgE: <0.1 kU/L (ref <0.10)
LAMB/MUTTON IGE: <0.1 kU/L (ref <0.35)
Pork IgE: <0.1 kU/L (ref <0.35)

## 2020-12-08 LAB — COMPREHENSIVE METABOLIC PANEL
AG Ratio: 1.7 (calc) (ref 1.0–2.5)
ALT: 8 U/L (ref 6–29)
AST: 11 U/L (ref 10–30)
Albumin: 4.7 g/dL (ref 3.6–5.1)
Alkaline phosphatase (APISO): 49 U/L (ref 31–125)
BUN: 9 mg/dL (ref 7–25)
CO2: 28 mmol/L (ref 20–32)
Calcium: 9.8 mg/dL (ref 8.6–10.2)
Chloride: 104 mmol/L (ref 98–110)
Creat: 0.74 mg/dL (ref 0.50–1.10)
Globulin: 2.8 g/dL (calc) (ref 1.9–3.7)
Glucose, Bld: 85 mg/dL (ref 65–99)
Potassium: 4.4 mmol/L (ref 3.5–5.3)
Sodium: 139 mmol/L (ref 135–146)
Total Bilirubin: 0.7 mg/dL (ref 0.2–1.2)
Total Protein: 7.5 g/dL (ref 6.1–8.1)

## 2020-12-11 ENCOUNTER — Other Ambulatory Visit (HOSPITAL_COMMUNITY): Payer: Self-pay

## 2020-12-14 ENCOUNTER — Other Ambulatory Visit (INDEPENDENT_AMBULATORY_CARE_PROVIDER_SITE_OTHER): Payer: Self-pay

## 2020-12-14 ENCOUNTER — Ambulatory Visit: Payer: 59 | Admitting: Professional

## 2020-12-14 ENCOUNTER — Encounter (INDEPENDENT_AMBULATORY_CARE_PROVIDER_SITE_OTHER): Payer: Self-pay

## 2020-12-14 DIAGNOSIS — R112 Nausea with vomiting, unspecified: Secondary | ICD-10-CM

## 2020-12-14 DIAGNOSIS — R197 Diarrhea, unspecified: Secondary | ICD-10-CM

## 2020-12-14 DIAGNOSIS — Z01812 Encounter for preprocedural laboratory examination: Secondary | ICD-10-CM

## 2020-12-28 ENCOUNTER — Ambulatory Visit: Payer: 59 | Admitting: Professional

## 2020-12-28 NOTE — Patient Instructions (Signed)
Kim [name]  12/28/2020     @PREFPERIOPPHARMACY @   Your procedure is scheduled on  01/06/2021.   Report to 01/08/2021 at  1130  A.M.   Call this number if you have problems the morning of surgery:  [phone]   Remember:  Follow the diet and prep instructions given to you by the office.    Take these medicines the morning of surgery with A SIP OF WATER      buspar, omeprazole, zoloft.     Please brush your teeth.  Do not wear jewelry, make-up or nail polish.  Do not wear lotions, powders, or perfumes, or deodorant.  Do not shave 48 hours prior to surgery.  Men may shave face and neck.  Do not bring valuables to the hospital.  [facility] is not responsible for any belongings or valuables.  Contacts, dentures or bridgework may not be worn into surgery.  Leave your suitcase in the car.  After surgery it may be brought to your room.  For patients admitted to the hospital, discharge time will be determined by your treatment team.  Patients discharged the day of surgery will not be allowed to drive home and must  have someone with them for 24 hours.    Special instructions:    DO NOT smoke tobacco or vape for 24 hours before your procedure.  Please read over the following fact sheets that you were given. Anesthesia Post-op Instructions and Care and Recovery After Surgery      Upper Endoscopy, Adult, Care After This sheet gives you information about how to care for yourself after your procedure. Your health care provider may also give you more specific instructions. If you have problems or questions, contact your health careprovider. What can I expect after the procedure? After the procedure, it is common to have: A sore throat. Mild stomach pain or discomfort. Bloating. Nausea. Follow these instructions at home:  Follow instructions from your health care provider about what to eat or drink after your procedure. Return to your normal activities  as told by your health care provider. Ask your health care provider what activities are safe for you. Take over-the-counter and prescription medicines only as told by your health care provider. If you were given a sedative during the procedure, it can affect you for several hours. Do not drive or operate machinery until your health care provider says that it is safe. Keep all follow-up visits as told by your health care provider. This is important. Contact a health care provider if you have: A sore throat that lasts longer than one day. Trouble swallowing. Get help right away if: You vomit blood or your vomit looks like coffee grounds. You have: A fever. Bloody, black, or tarry stools. A severe sore throat or you cannot swallow. Difficulty breathing. Severe pain in your chest or abdomen. Summary After the procedure, it is common to have a sore throat, mild stomach discomfort, bloating, and nausea. If you were given a sedative during the procedure, it can affect you for several hours. Do not drive or operate machinery until your health care provider says that it is safe. Follow instructions from your health care provider about what to eat or drink after your procedure. Return to your normal activities as told by your health care provider. This information is not intended to replace advice given to you by your health care provider. Make sure you discuss any questions you  have with your healthcare provider. Document Revised: 06/25/2019 Document Reviewed: 11/27/2017 Elsevier Patient Education  2022 [employer]. Colonoscopy, Adult, Care After This sheet gives you information about how to care for yourself after your procedure. Your health care provider may also give you more specific instructions. If you have problems or questions, contact your health careprovider. What can I expect after the procedure? After the procedure, it is common to have: A small amount of blood in your stool for 24  hours after the procedure. Some gas. Mild cramping or bloating of your abdomen. Follow these instructions at home: Eating and drinking  Drink enough fluid to keep your urine pale yellow. Follow instructions from your health care provider about eating or drinking restrictions. Resume your normal diet as instructed by your health care provider. Avoid heavy or fried foods that are hard to digest.  Activity Rest as told by your health care provider. Avoid sitting for a long time without moving. Get up to take short walks every 1-2 hours. This is important to improve blood flow and breathing. Ask for help if you feel weak or unsteady. Return to your normal activities as told by your health care provider. Ask your health care provider what activities are safe for you. Managing cramping and bloating  Try walking around when you have cramps or feel bloated. Apply heat to your abdomen as told by your health care provider. Use the heat source that your health care provider recommends, such as a moist heat pack or a heating pad. Place a towel between your skin and the heat source. Leave the heat on for 20-30 minutes. Remove the heat if your skin turns bright red. This is especially important if you are unable to feel pain, heat, or cold. You may have a greater risk of getting burned.  General instructions If you were given a sedative during the procedure, it can affect you for several hours. Do not drive or operate machinery until your health care provider says that it is safe. For the first 24 hours after the procedure: Do not sign important documents. Do not drink alcohol. Do your regular daily activities at a slower pace than normal. Eat soft foods that are easy to digest. Take over-the-counter and prescription medicines only as told by your health care provider. Keep all follow-up visits as told by your health care provider. This is important. Contact a health care provider if: You have  blood in your stool 2-3 days after the procedure. Get help right away if you have: More than a small spotting of blood in your stool. Large blood clots in your stool. Swelling of your abdomen. Nausea or vomiting. A fever. Increasing pain in your abdomen that is not relieved with medicine. Summary After the procedure, it is common to have a small amount of blood in your stool. You may also have mild cramping and bloating of your abdomen. If you were given a sedative during the procedure, it can affect you for several hours. Do not drive or operate machinery until your health care provider says that it is safe. Get help right away if you have a lot of blood in your stool, nausea or vomiting, a fever, or increased pain in your abdomen. This information is not intended to replace advice given to you by your health care provider. Make sure you discuss any questions you have with your healthcare provider. Document Revised: 06/21/2019 Document Reviewed: 01/21/2019 Elsevier Patient Education  2022 Else[employer] Inc. Monitored Anesthesia Care, Care  After This sheet gives you information about how to care for yourself after your procedure. Your health care provider may also give you more specific instructions. If you have problems or questions, contact your health careprovider. What can I expect after the procedure? After the procedure, it is common to have: Tiredness. Forgetfulness about what happened after the procedure. Impaired judgment for important decisions. Nausea or vomiting. Some difficulty with balance. Follow these instructions at home: For the time period you were told by your health care provider:     Rest as needed. Do not participate in activities where you could fall or become injured. Do not drive or use machinery. Do not drink alcohol. Do not take sleeping pills or medicines that cause drowsiness. Do not make important decisions or sign legal documents. Do not take care of  children on your own. Eating and drinking Follow the diet that is recommended by your health care provider. Drink enough fluid to keep your urine pale yellow. If you vomit: Drink water, juice, or soup when you can drink without vomiting. Make sure you have little or no nausea before eating solid foods. General instructions Have a responsible adult stay with you for the time you are told. It is important to have someone help care for you until you are awake and alert. Take over-the-counter and prescription medicines only as told by your health care provider. If you have sleep apnea, surgery and certain medicines can increase your risk for breathing problems. Follow instructions from your health care provider about wearing your sleep device: Anytime you are sleeping, including during daytime naps. While taking prescription pain medicines, sleeping medicines, or medicines that make you drowsy. Avoid smoking. Keep all follow-up visits as told by your health care provider. This is important. Contact a health care provider if: You keep feeling nauseous or you keep vomiting. You feel light-headed. You are still sleepy or having trouble with balance after 24 hours. You develop a rash. You have a fever. You have redness or swelling around the IV site. Get help right away if: You have trouble breathing. You have new-onset confusion at home. Summary For several hours after your procedure, you may feel tired. You may also be forgetful and have poor judgment. Have a responsible adult stay with you for the time you are told. It is important to have someone help care for you until you are awake and alert. Rest as told. Do not drive or operate machinery. Do not drink alcohol or take sleeping pills. Get help right away if you have trouble breathing, or if you suddenly become confused. This information is not intended to replace advice given to you by your health care provider. Make sure you discuss any  questions you have with your healthcare provider. Document Revised: 03/12/2020 Document Reviewed: 05/30/2019 Elsevier Patient Education  2022 [employer].

## 2020-12-30 ENCOUNTER — Encounter (HOSPITAL_COMMUNITY): Payer: Self-pay

## 2020-12-30 ENCOUNTER — Encounter (HOSPITAL_COMMUNITY)
Admission: RE | Admit: 2020-12-30 | Discharge: 2020-12-30 | Disposition: A | Discharge status: Home / Self Care | Payer: 59 | Source: Ambulatory Visit | Attending: Internal Medicine | Admitting: Internal Medicine

## 2020-12-30 ENCOUNTER — Other Ambulatory Visit: Payer: Self-pay

## 2020-12-30 DIAGNOSIS — R112 Nausea with vomiting, unspecified: Secondary | ICD-10-CM

## 2020-12-30 DIAGNOSIS — Z01812 Encounter for preprocedural laboratory examination: Secondary | ICD-10-CM | POA: Insufficient documentation

## 2020-12-30 DIAGNOSIS — R197 Diarrhea, unspecified: Secondary | ICD-10-CM

## 2020-12-30 HISTORY — DX: Gastro-esophageal reflux disease without esophagitis: K21.9

## 2020-12-30 LAB — PREGNANCY, URINE: Preg Test, Ur: NEGATIVE

## 2021-01-06 ENCOUNTER — Other Ambulatory Visit: Payer: Self-pay

## 2021-01-06 ENCOUNTER — Ambulatory Visit (HOSPITAL_COMMUNITY): Payer: 59 | Admitting: Certified Registered"

## 2021-01-06 ENCOUNTER — Ambulatory Visit (HOSPITAL_COMMUNITY)
Admission: RE | Admit: 2021-01-06 | Discharge: 2021-01-06 | Disposition: A | Discharge status: Home / Self Care | Payer: 59 | Attending: Internal Medicine | Admitting: Internal Medicine

## 2021-01-06 ENCOUNTER — Encounter (HOSPITAL_COMMUNITY)
Admission: RE | Disposition: A | Discharge status: Home / Self Care | Payer: Self-pay | Source: Home / Self Care | Attending: Internal Medicine

## 2021-01-06 ENCOUNTER — Encounter (HOSPITAL_COMMUNITY): Payer: Self-pay | Admitting: Internal Medicine

## 2021-01-06 DIAGNOSIS — R109 Unspecified abdominal pain: Secondary | ICD-10-CM | POA: Diagnosis not present

## 2021-01-06 DIAGNOSIS — R634 Abnormal weight loss: Secondary | ICD-10-CM | POA: Insufficient documentation

## 2021-01-06 DIAGNOSIS — F418 Other specified anxiety disorders: Secondary | ICD-10-CM | POA: Diagnosis not present

## 2021-01-06 DIAGNOSIS — Z6823 Body mass index (BMI) 23.0-23.9, adult: Secondary | ICD-10-CM | POA: Insufficient documentation

## 2021-01-06 DIAGNOSIS — Z880 Allergy status to penicillin: Secondary | ICD-10-CM | POA: Diagnosis not present

## 2021-01-06 DIAGNOSIS — R1110 Vomiting, unspecified: Secondary | ICD-10-CM | POA: Diagnosis not present

## 2021-01-06 DIAGNOSIS — R197 Diarrhea, unspecified: Secondary | ICD-10-CM | POA: Diagnosis not present

## 2021-01-06 DIAGNOSIS — K319 Disease of stomach and duodenum, unspecified: Secondary | ICD-10-CM | POA: Diagnosis not present

## 2021-01-06 DIAGNOSIS — Z79899 Other long term (current) drug therapy: Secondary | ICD-10-CM | POA: Diagnosis not present

## 2021-01-06 DIAGNOSIS — R1011 Right upper quadrant pain: Secondary | ICD-10-CM | POA: Insufficient documentation

## 2021-01-06 DIAGNOSIS — R1013 Epigastric pain: Secondary | ICD-10-CM | POA: Insufficient documentation

## 2021-01-06 DIAGNOSIS — R1031 Right lower quadrant pain: Secondary | ICD-10-CM | POA: Diagnosis not present

## 2021-01-06 DIAGNOSIS — Z881 Allergy status to other antibiotic agents status: Secondary | ICD-10-CM | POA: Insufficient documentation

## 2021-01-06 DIAGNOSIS — Z91040 Latex allergy status: Secondary | ICD-10-CM | POA: Diagnosis not present

## 2021-01-06 DIAGNOSIS — R112 Nausea with vomiting, unspecified: Secondary | ICD-10-CM | POA: Insufficient documentation

## 2021-01-06 DIAGNOSIS — K2289 Other specified disease of esophagus: Secondary | ICD-10-CM | POA: Diagnosis not present

## 2021-01-06 DIAGNOSIS — K449 Diaphragmatic hernia without obstruction or gangrene: Secondary | ICD-10-CM | POA: Insufficient documentation

## 2021-01-06 DIAGNOSIS — K3189 Other diseases of stomach and duodenum: Secondary | ICD-10-CM | POA: Diagnosis not present

## 2021-01-06 HISTORY — PX: BIOPSY: SHX5522

## 2021-01-06 HISTORY — PX: ESOPHAGOGASTRODUODENOSCOPY (EGD) WITH PROPOFOL: SHX5813

## 2021-01-06 HISTORY — PX: COLONOSCOPY WITH PROPOFOL: SHX5780

## 2021-01-06 SURGERY — COLONOSCOPY WITH PROPOFOL
Anesthesia: General

## 2021-01-06 MED ORDER — PROPOFOL 500 MG/50ML IV EMUL
INTRAVENOUS | Status: DC | PRN
  Administered 2021-01-06: 150 ug/kg/min via INTRAVENOUS
  Administered 2021-01-06: 100 mg via INTRAVENOUS

## 2021-01-06 MED ORDER — LACTATED RINGERS IV SOLN: INTRAVENOUS | Status: DC

## 2021-01-06 MED ORDER — ONDANSETRON HCL 4 MG/2ML IJ SOLN
4.0000 mg | Freq: Once | INTRAMUSCULAR | Status: AC
  Administered 2021-01-06: 4 mg via INTRAVENOUS

## 2021-01-06 MED ORDER — ONDANSETRON HCL 4 MG/2ML IJ SOLN
INTRAMUSCULAR | Status: AC
  Filled 2021-01-06: qty 2

## 2021-01-06 MED ORDER — LIDOCAINE HCL (PF) 2 % IJ SOLN
INTRAMUSCULAR | Status: AC
  Filled 2021-01-06: qty 5

## 2021-01-06 MED ORDER — LIDOCAINE HCL (CARDIAC) PF 100 MG/5ML IV SOSY
PREFILLED_SYRINGE | INTRAVENOUS | Status: DC | PRN
  Administered 2021-01-06: 100 mg via INTRAVENOUS

## 2021-01-06 NOTE — Anesthesia Preprocedure Evaluation (Signed)
Anesthesia Evaluation  Patient identified by MRN, date of birth, ID band Patient awake    Reviewed: Allergy & Precautions, H&P , NPO status , Patient's Chart, lab work & pertinent test results, reviewed documented beta blocker date and time   Airway Mallampati: II  TM Distance: >3 FB Neck ROM: full    Dental no notable dental hx.    Pulmonary neg pulmonary ROS,    Pulmonary exam normal breath sounds clear to auscultation       Cardiovascular Exercise Tolerance: Good negative cardio ROS   Rhythm:regular Rate:Normal     Neuro/Psych  Headaches, PSYCHIATRIC DISORDERS Anxiety Depression    GI/Hepatic Neg liver ROS, GERD  Medicated,  Endo/Other  negative endocrine ROS  Renal/GU negative Renal ROS  negative genitourinary   Musculoskeletal   Abdominal   Peds  Hematology negative hematology ROS (+)   Anesthesia Other Findings   Reproductive/Obstetrics negative OB ROS                             Anesthesia Physical Anesthesia Plan  ASA: 2  Anesthesia Plan: General   Post-op Pain Management:    Induction:   PONV Risk Score and Plan: Propofol infusion  Airway Management Planned:   Additional Equipment:   Intra-op Plan:   Post-operative Plan:   Informed Consent: I have reviewed the patients History and Physical, chart, labs and discussed the procedure including the risks, benefits and alternatives for the proposed anesthesia with the patient or authorized representative who has indicated his/her understanding and acceptance.     Dental Advisory Given  Plan Discussed with: CRNA  Anesthesia Plan Comments:         Anesthesia Quick Evaluation

## 2021-01-06 NOTE — Interval H&P Note (Signed)
Patient feels better.  She states her throat is not sore anymore.  She has finished fluconazole for oropharyngeal candidiasis. She states her bowels are moving since she has been taking Linzess daily. She denies chest pain shortness of breath abdominal pain melena or rectal bleeding.  She also denies dysphagia. Eye examination is otherwise unchanged. History and Physical Interval Note:  01/06/2021 2:06 PM  Kim [name]  has presented today for surgery, with the diagnosis of Vomiting, Diarrhea, abdominal pain.  The various methods of treatment have been discussed with the patient and family. After consideration of risks, benefits and other options for treatment, the patient has consented to  Procedure(s) with comments: COLONOSCOPY WITH PROPOFOL (N/A) - 1:20 ESOPHAGOGASTRODUODENOSCOPY (EGD) WITH PROPOFOL (N/A) BIOPSY as a surgical intervention.  The patient's history has been reviewed, patient examined, no change in status, stable for surgery.  I have reviewed the patient's chart and labs.  Questions were answered to the patient's satisfaction.     [employer]

## 2021-01-06 NOTE — Discharge Instructions (Signed)
No aspirin or NSAIDs for 24 hours ?Resume usual medications and diet as before. ?No driving for 24 hours. ?Physician will call with biopsy results and further recommendations. ?

## 2021-01-06 NOTE — Progress Notes (Signed)
Kim [name] is to return to work on 01/08/21/ per Dr. Karilyn Cota. If there are any questions please call [facility] at [phone].

## 2021-01-06 NOTE — H&P (Signed)
Kim [name] is an 25 y.o. female.   Chief Complaint: Patient is here for esophagogastroduodenoscopy and colonoscopy. HPI: Patient is 25 year old Caucasian female who was seen in the office in consultation on 11/17/2020 for abdominal pain and nausea vomiting and diarrhea.  She has not responded to antispasmodic therapy as well as PPI.  She also had stool studies which are negative.  Screening for celiac disease was negative as was test for alpha gal allergy.  Abdominal ultrasound been unremarkable.  She remains with the symptoms.  She also has lost 25 pounds in last 6 months.  She is therefore undergoing diagnostic EGD and colonoscopy.  Family history is negative for inflammatory bowel disease.  No history of melena rectal bleeding or hematemesis.  Past Medical History:  Diagnosis Date   ADHD (attention deficit hyperactivity disorder)    Anxiety    Depression    Encounter for gynecological examination with Papanicolaou smear of cervix 03/27/2017   Encounter for surveillance of contraceptive pills 03/27/2017   GERD (gastroesophageal reflux disease)    Migraines     History reviewed. No pertinent surgical history.  Family History  Problem Relation Age of Onset   Cancer Paternal Grandfather    Alzheimer's disease Paternal Grandmother    Cancer Maternal Grandmother        breast    Cancer Maternal Grandfather        pancreatic and bone   Cancer Father        lung   Hypertension Mother    Social History:  reports that she has never smoked. She has never used smokeless tobacco. She reports current alcohol use. She reports that she does not use drugs.  Allergies:  Allergies  Allergen Reactions   Amoxicillin Rash   Penicillins Rash   Doxycycline Hives   Dicyclomine Rash   Latex Itching    Medications Prior to Admission  Medication Sig Dispense Refill   acetaminophen (TYLENOL) 500 MG tablet Take 500-1,000 mg by mouth every 6 (six) hours as needed for moderate pain or headache.      Ascorbic Acid (VITAMIN C PO) Take 1 tablet by mouth daily.     busPIRone (BUSPAR) 10 MG tablet Take 0.5 tablets (5 mg total) by mouth 3 (three) times daily. 90 tablet 1   Chlorphen-PE-Acetaminophen (NOREL AD) 4-10-325 MG TABS Take 1 tablet by mouth every 4 (four) hours as needed (nasal congestion, cold symptoms). 20 tablet 1   fluticasone (FLONASE) 50 MCG/ACT nasal spray Use 2 sprays in each nostril twice daily for a week. After 1 week, decrease to 1 spray in each nostril twice daily  as needed for congestion/allergies. (Patient taking differently: Place 2 sprays into both nostrils daily as needed for allergies.) 16 g 6   Hyoscyamine Sulfate SL (LEVSIN/SL) 0.125 MG SUBL Place 1 tablet under the tongue to dissolve 3  times daily before meals. (Patient taking differently: Place 0.125 mg under the tongue 3 (three) times daily before meals.) 90 tablet 1   Ibuprofen 200 MG CAPS Take 200-400 mg by mouth 2 (two) times daily as needed (pain). For abdominal pain     omeprazole (PRILOSEC) 40 MG capsule TAKE 1 CAPSULE BY MOUTH ONCE DAILY ON AN EMPTY STOMACH 1 HOUR PRIOR TO EATING OR DRINKING. (Patient taking differently: Take 40 mg by mouth daily.) 90 capsule 0   sertraline (ZOLOFT) 50 MG tablet Take 1 tablet (50 mg total) by mouth daily. 30 tablet 2   VITAMIN D PO Take 1 capsule by mouth  daily.     Cyanocobalamin (B-12 PO) Take 1 tablet by mouth daily.      No results found for this or any previous visit (from the past 48 hour(s)). No results found.  Review of Systems  Blood pressure 126/72, pulse 85, temperature 98.2 F (36.8 C), temperature source Oral, resp. rate (!) 21, last menstrual period 12/29/2020, SpO2 100 %. Physical Exam HENT:     Mouth/Throat:     Mouth: Mucous membranes are moist.     Pharynx: Oropharynx is clear.  Eyes:     General: No scleral icterus.    Conjunctiva/sclera: Conjunctivae normal.  Cardiovascular:     Rate and Rhythm: Normal rate and regular rhythm.     Heart  sounds: Normal heart sounds. No murmur heard. Pulmonary:     Effort: Pulmonary effort is normal.     Breath sounds: Normal breath sounds.  Abdominal:     Comments: Abdomen is flat.  On palpation is soft.  She has tenderness in epigastric region and right upper and lower quadrants.  She is most tender in midepigastric region.  No organomegaly or masses.  Musculoskeletal:        General: No swelling.     Cervical back: Neck supple.  Lymphadenopathy:     Cervical: No cervical adenopathy.  Skin:    General: Skin is warm and dry.  Neurological:     Mental Status: She is alert.     Assessment/Plan  Nausea and vomiting and diarrhea and abdominal pain. Diagnostic esophagogastroduodenoscopy followed by colonoscopy.  Lionel December, MD 01/06/2021, 12:59 PM

## 2021-01-06 NOTE — Transfer of Care (Signed)
Immediate Anesthesia Transfer of Care Note  Patient: Kim [name]  Procedure(s) Performed: COLONOSCOPY WITH PROPOFOL ESOPHAGOGASTRODUODENOSCOPY (EGD) WITH PROPOFOL BIOPSY  Patient Location: Short Stay  Anesthesia Type:General  Level of Consciousness: awake, alert  and oriented  Airway & Oxygen Therapy: Patient Spontanous Breathing  Post-op Assessment: Report given to RN and Post -op Vital signs reviewed and stable  Post vital signs: Reviewed and stable  Last Vitals:  Vitals Value Taken Time  BP 95/58 01/06/21 1351  Temp 36.4 C 01/06/21 1351  Pulse 76 01/06/21 1351  Resp 18 01/06/21 1351  SpO2 100 % 01/06/21 1351    Last Pain:  Vitals:   01/06/21 1351  TempSrc: Oral  PainSc: 0-No pain      Patients Stated Pain Goal: 7 (01/06/21 1151)  Complications: No notable events documented.

## 2021-01-06 NOTE — Op Note (Signed)
[facility] Patient Name: Kim [name] Procedure Date: 01/06/2021 1:23 PM MRN: [id number] Date of Birth: 16-Jun-1996 Attending MD: Lionel December , MD CSN: [id number] Age: 25 Admit Type: Outpatient Procedure:                Colonoscopy Indications:              Abdominal pain in the right lower quadrant,                            Clinically significant diarrhea of unexplained                            origin Providers:                Lionel December, MD, Brain Hilts, RN, Kristine L.                            Jessee Avers, Technician Referring MD:             Tereasa Coop, FNP Medicines:                Propofol per Anesthesia Complications:            No immediate complications. Estimated Blood Loss:     Estimated blood loss was minimal. Procedure:                Pre-Anesthesia Assessment:                           - Prior to the procedure, a History and Physical                            was performed, and patient medications and                            allergies were reviewed. The patient's tolerance of                            previous anesthesia was also reviewed. The risks                            and benefits of the procedure and the sedation                            options and risks were discussed with the patient.                            All questions were answered, and informed consent                            was obtained. Prior Anticoagulants: The patient has                            taken no previous anticoagulant or antiplatelet                            agents except  for NSAID medication. ASA Grade                            Assessment: II - A patient with mild systemic                            disease. After reviewing the risks and benefits,                            the patient was deemed in satisfactory condition to                            undergo the procedure.                           After obtaining informed consent, the  colonoscope                            was passed under direct vision. Throughout the                            procedure, the patient's blood pressure, pulse, and                            oxygen saturations were monitored continuously. The                            [id number]-[id number] ([id number] scope was introduced through                            the anus and advanced to the the terminal ileum,                            with identification of the appendiceal orifice and                            IC valve. The colonoscopy was technically difficult                            and complex due to a redundant colon. Successful                            completion of the procedure was aided by using                            manual pressure and scope guide. The patient                            tolerated the procedure well. The quality of the                            bowel preparation was adequate. The terminal ileum,  ileocecal valve, appendiceal orifice, and rectum                            were photographed. Scope In: 1:24:47 PM Scope Out: 1:46:52 PM Scope Withdrawal Time: 0 hours 10 minutes 8 seconds  Total Procedure Duration: 0 hours 22 minutes 5 seconds  Findings:      The perianal and digital rectal examinations were normal.      The terminal ileum appeared normal.      The colon (entire examined portion) appeared normal. Biopsies for       histology were taken with a cold forceps from the ascending colon and       sigmoid colon for evaluation of microscopic colitis. The pathology       specimen was placed into Bottle Number 3.      The retroflexed view of the distal rectum and anal verge was normal and       showed no anal or rectal abnormalities. Impression:               - The examined portion of the ileum was normal.                           - The entire examined colon is normal. Biopsied. Moderate Sedation:      Per Anesthesia  Care Recommendation:           - Patient has a contact number available for                            emergencies. The signs and symptoms of potential                            delayed complications were discussed with the                            patient. Return to normal activities tomorrow.                            Written discharge instructions were provided to the                            patient.                           - Resume previous diet today.                           - Continue present medications.                           - Await pathology results. Procedure Code(s):        --- Professional ---                           [phone], Colonoscopy, flexible; with biopsy, single                            or multiple Diagnosis Code(s):        ---  Professional ---                           R10.31, Right lower quadrant pain                           R19.7, Diarrhea, unspecified CPT copyright 2019 [employer]. All rights reserved. The codes documented in this report are preliminary and upon coder review may  be revised to meet current compliance requirements. Lionel December, MD Lionel December, MD 01/06/2021 1:59:14 PM This report has been signed electronically. Number of Addenda: 0

## 2021-01-06 NOTE — Op Note (Signed)
[facility] Patient Name: Kim [name] Procedure Date: 01/06/2021 12:17 PM MRN: [id number] Date of Birth: Feb 19, 1996 Attending MD: Lionel December , MD CSN: [id number] Age: 25 Admit Type: Outpatient Procedure:                Upper GI endoscopy Indications:              Epigastric abdominal pain, Abdominal pain in the                            right upper quadrant, Nausea with vomiting, Weight                            loss Providers:                Lionel December, MD, Brain Hilts, RN, Kristine L.                            Jessee Avers, Technician Referring MD:             , Tereasa Coop, NP Medicines:                Propofol per Anesthesia Complications:            No immediate complications. Estimated Blood Loss:     Estimated blood loss was minimal. Procedure:                Pre-Anesthesia Assessment:                           - Prior to the procedure, a History and Physical                            was performed, and patient medications and                            allergies were reviewed. The patient's tolerance of                            previous anesthesia was also reviewed. The risks                            and benefits of the procedure and the sedation                            options and risks were discussed with the patient.                            All questions were answered, and informed consent                            was obtained. Prior Anticoagulants: The patient has                            taken no previous anticoagulant or antiplatelet  agents except for NSAID medication. ASA Grade                            Assessment: II - A patient with mild systemic                            disease. After reviewing the risks and benefits,                            the patient was deemed in satisfactory condition to                            undergo the procedure.                           After obtaining informed  consent, the endoscope was                            passed under direct vision. Throughout the                            procedure, the patient's blood pressure, pulse, and                            oxygen saturations were monitored continuously. The                            [id number]) was introduced through the mouth,                            and advanced to the second part of duodenum. The                            upper GI endoscopy was accomplished without                            difficulty. The patient tolerated the procedure                            well. Scope In: 1:09:06 PM Scope Out: 1:20:17 PM Total Procedure Duration: 0 hours 11 minutes 11 seconds  Findings:      The hypopharynx was normal.      The examined esophagus was normal.      The Z-line was irregular and was found 42 cm from the incisors.      A 2 cm hiatal hernia was present.      The entire examined stomach was normal. Biopsies were taken with a cold       forceps for histology. The pathology specimen was placed into Bottle       Number 2.      The duodenal bulb and second portion of the duodenum were normal.       Biopsies were taken with a cold forceps for histology. The pathology       specimen was placed into Bottle Number 1. Impression:               -  Normal hypopharynx.                           - Normal esophagus.                           - Z-line irregular, 42 cm from the incisors.                           - 2 cm hiatal hernia.                           - Normal stomach. Biopsied.                           - Normal duodenal bulb and second portion of the                            duodenum. Biopsied. Moderate Sedation:      Per Anesthesia Care Recommendation:           - Patient has a contact number available for                            emergencies. The signs and symptoms of potential                            delayed complications were discussed with the                             patient. Return to normal activities tomorrow.                            Written discharge instructions were provided to the                            patient.                           - Resume previous diet today.                           - Continue present medications.                           - No aspirin, ibuprofen, naproxen, or other                            non-steroidal anti-inflammatory drugs for 1 day.                           - Await pathology results. Procedure Code(s):        --- Professional ---                           [phone], Esophagogastroduodenoscopy, flexible,  transoral; with biopsy, single or multiple Diagnosis Code(s):        --- Professional ---                           K22.8, Other specified diseases of esophagus                           K44.9, Diaphragmatic hernia without obstruction or                            gangrene                           R10.13, Epigastric pain                           R10.11, Right upper quadrant pain                           R11.2, Nausea with vomiting, unspecified                           R63.4, Abnormal weight loss CPT copyright 2019 American Medical Association. All rights reserved. The codes documented in this report are preliminary and upon coder review may  be revised to meet current compliance requirements. Lionel DecemberNajeeb Newell Wafer, MD Lionel DecemberNajeeb Lestine Rahe, MD 01/06/2021 1:54:30 PM This report has been signed electronically. Number of Addenda: 0

## 2021-01-07 NOTE — Anesthesia Postprocedure Evaluation (Signed)
Anesthesia Post Note  Patient: Kim [name]  Procedure(s) Performed: COLONOSCOPY WITH PROPOFOL ESOPHAGOGASTRODUODENOSCOPY (EGD) WITH PROPOFOL BIOPSY  Patient location during evaluation: Phase II Anesthesia Type: General Level of consciousness: awake Pain management: pain level controlled Vital Signs Assessment: post-procedure vital signs reviewed and stable Respiratory status: spontaneous breathing and respiratory function stable Cardiovascular status: blood pressure returned to baseline and stable Postop Assessment: no headache and no apparent nausea or vomiting Anesthetic complications: no Comments: Late entry   No notable events documented.   Last Vitals:  Vitals:   01/06/21 1351 01/06/21 1359  BP: (!) 95/58 106/66  Pulse: 76   Resp: 18   Temp: 36.4 C   SpO2: 100%     Last Pain:  Vitals:   01/06/21 1351  TempSrc: Oral  PainSc: 0-No pain                 Windell Norfolk

## 2021-01-08 LAB — SURGICAL PATHOLOGY

## 2021-01-13 ENCOUNTER — Encounter (HOSPITAL_COMMUNITY): Payer: Self-pay | Admitting: Internal Medicine

## 2021-01-13 ENCOUNTER — Other Ambulatory Visit (INDEPENDENT_AMBULATORY_CARE_PROVIDER_SITE_OTHER): Payer: Self-pay | Admitting: Internal Medicine

## 2021-01-13 MED ORDER — HYOSCYAMINE SULFATE ER 0.375 MG PO TB12: 0.3750 mg | ORAL_TABLET | Freq: Every day | ORAL | 5 refills | Status: DC

## 2021-01-14 ENCOUNTER — Ambulatory Visit (INDEPENDENT_AMBULATORY_CARE_PROVIDER_SITE_OTHER): Payer: Self-pay | Admitting: Professional

## 2021-01-14 DIAGNOSIS — F411 Generalized anxiety disorder: Secondary | ICD-10-CM

## 2021-01-14 DIAGNOSIS — F4323 Adjustment disorder with mixed anxiety and depressed mood: Secondary | ICD-10-CM

## 2021-01-25 ENCOUNTER — Ambulatory Visit: Payer: Self-pay | Admitting: Professional

## 2021-01-30 ENCOUNTER — Other Ambulatory Visit (HOSPITAL_COMMUNITY): Payer: Self-pay

## 2021-02-02 LAB — LIPID PANEL
Chol/HDL Ratio: 3.3 ratio (ref 0.0–4.4)
Cholesterol, Total: 135 mg/dL (ref 100–199)
HDL: 41 mg/dL (ref >39)
LDL Chol Calc (NIH): 84 mg/dL (ref 0–99)
Triglycerides: 40 mg/dL (ref 0–149)
VLDL Cholesterol Cal: 10 mg/dL (ref 5–40)

## 2021-02-02 LAB — CBC WITH DIFFERENTIAL/PLATELET
Basophils Absolute: 0 10*3/uL (ref 0.0–0.2)
Basos: 1 %
EOS (ABSOLUTE): 0.1 10*3/uL (ref 0.0–0.4)
Eos: 1 %
Hematocrit: 39.6 % (ref 34.0–46.6)
Hemoglobin: 13.3 g/dL (ref 11.1–15.9)
Immature Grans (Abs): 0 10*3/uL (ref 0.0–0.1)
Immature Granulocytes: 0 %
Lymphocytes Absolute: 1.5 10*3/uL (ref 0.7–3.1)
Lymphs: 27 %
MCH: 30.9 pg (ref 26.6–33.0)
MCHC: 33.6 g/dL (ref 31.5–35.7)
MCV: 92 fL (ref 79–97)
Monocytes Absolute: 0.3 10*3/uL (ref 0.1–0.9)
Monocytes: 6 %
Neutrophils Absolute: 3.6 10*3/uL (ref 1.4–7.0)
Neutrophils: 65 %
Platelets: 222 10*3/uL (ref 150–450)
RBC: 4.31 x10E6/uL (ref 3.77–5.28)
RDW: 12.3 % (ref 11.7–15.4)
WBC: 5.6 10*3/uL (ref 3.4–10.8)

## 2021-02-02 LAB — CMP14+EGFR
ALT: 21 IU/L (ref 0–32)
AST: 19 IU/L (ref 0–40)
Albumin/Globulin Ratio: 1.8 (ref 1.2–2.2)
Albumin: 4.9 g/dL (ref 3.9–5.0)
Alkaline Phosphatase: 62 IU/L (ref 44–121)
BUN/Creatinine Ratio: 14 (ref 9–23)
BUN: 9 mg/dL (ref 6–20)
Bilirubin Total: 0.8 mg/dL (ref 0.0–1.2)
CO2: 22 mmol/L (ref 20–29)
Calcium: 9.8 mg/dL (ref 8.7–10.2)
Chloride: 102 mmol/L (ref 96–106)
Creatinine, Ser: 0.65 mg/dL (ref 0.57–1.00)
Globulin, Total: 2.7 g/dL (ref 1.5–4.5)
Glucose: 78 mg/dL (ref 65–99)
Potassium: 4.5 mmol/L (ref 3.5–5.2)
Sodium: 138 mmol/L (ref 134–144)
Total Protein: 7.6 g/dL (ref 6.0–8.5)
eGFR: 125 mL/min/{1.73_m2} (ref >59)

## 2021-02-02 LAB — HEMOGLOBIN A1C
Est. average glucose Bld gHb Est-mCnc: 97 mg/dL
Hgb A1c MFr Bld: 5 % (ref 4.8–5.6)

## 2021-02-02 LAB — HIV ANTIBODY (ROUTINE TESTING W REFLEX): HIV Screen 4th Generation wRfx: NONREACTIVE

## 2021-02-02 LAB — VITAMIN D 25 HYDROXY (VIT D DEFICIENCY, FRACTURES): Vit D, 25-Hydroxy: 23.9 ng/mL — ABNORMAL LOW (ref 30.0–100.0)

## 2021-02-02 LAB — TSH: TSH: 1.57 u[IU]/mL (ref 0.450–4.500)

## 2021-02-02 LAB — HEPATITIS C ANTIBODY: Hep C Virus Ab: 0.1 s/co ratio (ref 0.0–0.9)

## 2021-02-04 ENCOUNTER — Other Ambulatory Visit (HOSPITAL_COMMUNITY): Payer: Self-pay

## 2021-02-04 ENCOUNTER — Other Ambulatory Visit: Payer: Self-pay

## 2021-02-04 ENCOUNTER — Encounter: Payer: Self-pay | Admitting: Nurse Practitioner

## 2021-02-04 ENCOUNTER — Ambulatory Visit (INDEPENDENT_AMBULATORY_CARE_PROVIDER_SITE_OTHER): Payer: Self-pay | Admitting: Nurse Practitioner

## 2021-02-04 DIAGNOSIS — F411 Generalized anxiety disorder: Secondary | ICD-10-CM

## 2021-02-04 DIAGNOSIS — G43919 Migraine, unspecified, intractable, without status migrainosus: Secondary | ICD-10-CM | POA: Insufficient documentation

## 2021-02-04 DIAGNOSIS — R3914 Feeling of incomplete bladder emptying: Secondary | ICD-10-CM

## 2021-02-04 DIAGNOSIS — Z0001 Encounter for general adult medical examination with abnormal findings: Secondary | ICD-10-CM

## 2021-02-04 DIAGNOSIS — R197 Diarrhea, unspecified: Secondary | ICD-10-CM

## 2021-02-04 DIAGNOSIS — G43009 Migraine without aura, not intractable, without status migrainosus: Secondary | ICD-10-CM

## 2021-02-04 DIAGNOSIS — E559 Vitamin D deficiency, unspecified: Secondary | ICD-10-CM

## 2021-02-04 DIAGNOSIS — G43909 Migraine, unspecified, not intractable, without status migrainosus: Secondary | ICD-10-CM | POA: Insufficient documentation

## 2021-02-04 LAB — POCT URINALYSIS DIPSTICK
Bilirubin, UA: NEGATIVE
Blood, UA: NEGATIVE
Glucose, UA: NEGATIVE
Ketones, UA: NEGATIVE
Leukocytes, UA: NEGATIVE
Nitrite, UA: NEGATIVE
Protein, UA: NEGATIVE
Spec Grav, UA: 1.02 (ref 1.010–1.025)
Urobilinogen, UA: 0.2 E.U./dL
pH, UA: 6 (ref 5.0–8.0)

## 2021-02-04 MED ORDER — RIZATRIPTAN BENZOATE 5 MG PO TBDP: 5.0000 mg | ORAL_TABLET | ORAL | 0 refills | Status: DC | PRN

## 2021-02-04 MED ORDER — VITAMIN D (ERGOCALCIFEROL) 1.25 MG (50000 UNIT) PO CAPS
50000.0000 [IU] | ORAL_CAPSULE | ORAL | 0 refills | Status: DC
  Filled 2021-02-04: qty 12, 84d supply, fill #0

## 2021-02-04 MED ORDER — NURTEC 75 MG PO TBDP: 1.0000 | ORAL_TABLET | ORAL | 5 refills | Status: DC

## 2021-02-04 MED ORDER — VITAMIN D (ERGOCALCIFEROL) 1.25 MG (50000 UNIT) PO CAPS: 50000.0000 [IU] | ORAL_CAPSULE | ORAL | 0 refills | Status: DC

## 2021-02-04 MED ORDER — ESCITALOPRAM OXALATE 5 MG PO TABS
5.0000 mg | ORAL_TABLET | Freq: Every day | ORAL | 1 refills | Status: DC
  Filled 2021-02-04: qty 90, 90d supply, fill #0

## 2021-02-04 MED ORDER — ESCITALOPRAM OXALATE 5 MG PO TABS
5.0000 mg | ORAL_TABLET | Freq: Every day | ORAL | 1 refills | Status: DC
Start: 2021-02-04 — End: 2021-11-17

## 2021-02-04 MED ORDER — RIZATRIPTAN BENZOATE 5 MG PO TBDP
5.0000 mg | ORAL_TABLET | ORAL | 0 refills | Status: DC | PRN
  Filled 2021-02-04: qty 10, 20d supply, fill #0

## 2021-02-04 NOTE — Assessment & Plan Note (Signed)
Last vitamin D Lab Results  Component Value Date   VD25OH 23.9 (L) 02/01/2021  -Rx. Vit D x 12 weeks -after 12 weeks take 5000 IU PO daily

## 2021-02-04 NOTE — Assessment & Plan Note (Signed)
-  PE completed today

## 2021-02-04 NOTE — Assessment & Plan Note (Signed)
-  had mild pain to palpation over bladder -U/A today is negative -will employ watchful waiting; if this gets worse, return to clinic and may consider urology referral;

## 2021-02-04 NOTE — Assessment & Plan Note (Signed)
-  states she has 4 migraine days per month -Rx. Sumatriptan; for abortive therapy; she took this in the past and got some relief, but it made her sleepy -Rx. Nurtec; for maintenance therapy

## 2021-02-04 NOTE — Addendum Note (Signed)
Addended by: Jerilynn Mages on: 02/04/2021 09:31 AM   Modules accepted: Orders

## 2021-02-04 NOTE — Assessment & Plan Note (Addendum)
-  continue buspar -she thinks that sertraline is cuasing diarrhea; STOP this d/t side effects -Rx. Lexapro; starting low dose since she was on low-dose sertraline and want lowest chance of causing side effects

## 2021-02-04 NOTE — Assessment & Plan Note (Addendum)
-  has hx of IBS, but she thinks that sertraline may be causing the issue;swapping to lexapro

## 2021-02-04 NOTE — Progress Notes (Signed)
Established Patient Office Visit  Subjective:  Patient ID: Kim [name], female    DOB: 24-Sep-1995  Age: 25 y.o. MRN: [id number]  CC:  Chief Complaint  Patient presents with   Annual Exam    HPI Kim [name] presents for physical exam. She sees Dr. Philis Pique for GYN care.  She has migraines, and she estimates that she has 4 migraine days per month. She tried sumatriptan and that helped.  She states that she has had GI upset and diarrhea after taking sertraline. She would like to try something different for anxiety.  Past Medical History:  Diagnosis Date   ADHD (attention deficit hyperactivity disorder)    Anxiety    Depression    Encounter for gynecological examination with Papanicolaou smear of cervix 03/27/2017   Encounter for surveillance of contraceptive pills 03/27/2017   GERD (gastroesophageal reflux disease)    Migraines     Past Surgical History:  Procedure Laterality Date   BIOPSY  01/06/2021   Procedure: BIOPSY;  Surgeon: Rogene Houston, MD;  Location: [facility];  Service: Endoscopy;;   COLONOSCOPY WITH PROPOFOL N/A 01/06/2021   Procedure: COLONOSCOPY WITH PROPOFOL;  Surgeon: Rogene Houston, MD;  Location: [facility];  Service: Endoscopy;  Laterality: N/A;  1:20   ESOPHAGOGASTRODUODENOSCOPY (EGD) WITH PROPOFOL N/A 01/06/2021   Procedure: ESOPHAGOGASTRODUODENOSCOPY (EGD) WITH PROPOFOL;  Surgeon: Rogene Houston, MD;  Location: [facility];  Service: Endoscopy;  Laterality: N/A;    Family History  Problem Relation Age of Onset   Cancer Paternal Grandfather    Alzheimer's disease Paternal Grandmother    Cancer Maternal Grandmother        breast    Cancer Maternal Grandfather        pancreatic and bone   Cancer Father        lung   Hypertension Mother     Social History   Socioeconomic History   Marital status: Single    Spouse name: Not on file   Number of children: Not on file   Years of education: Not on file   Highest  education level: Associate degree: occupational, Hotel manager, or vocational program  Occupational History   Not on file  Tobacco Use   Smoking status: Never   Smokeless tobacco: Never  Vaping Use   Vaping Use: Never used  Substance and Sexual Activity   Alcohol use: Yes    Comment: monthly   Drug use: No   Sexual activity: Yes    Birth control/protection: I.U.D.  Other Topics Concern   Not on file  Social History Narrative   Lives with mom, step dad      Pets: 3 dogs: Skimp, Ginger, and Evie May      Enjoys: read-genre: poetry, movies and music      Diet: eats all food groups, snacks with stress   Caffeine: coffee-2 cups; tea daily, mountain dew    Water: 1-2 cups daily       Does not wear seat belt due to being anxiety    Does not use phone while driving   Oceanographer at home    Social Determinants of Health   Financial Resource Strain: Not on file  Food Insecurity: Not on file  Transportation Needs: Not on file  Physical Activity: Not on file  Stress: Not on file  Social Connections: Not on file  Intimate Partner Violence: Not on file    Outpatient Medications Prior to Visit  Medication Sig Dispense  Refill   acetaminophen (TYLENOL) 500 MG tablet Take 500-1,000 mg by mouth every 6 (six) hours as needed for moderate pain or headache.     Ascorbic Acid (VITAMIN C PO) Take 1 tablet by mouth daily.     busPIRone (BUSPAR) 10 MG tablet Take 0.5 tablets (5 mg total) by mouth 3 (three) times daily. 90 tablet 1   Cyanocobalamin (B-12 PO) Take 1 tablet by mouth daily.     fluticasone (FLONASE) 50 MCG/ACT nasal spray Use 2 sprays in each nostril twice daily for a week. After 1 week, decrease to 1 spray in each nostril twice daily  as needed for congestion/allergies. 16 g 6   hyoscyamine (LEVBID) 0.375 MG 12 hr tablet Take 1 tablet (0.375 mg total) by mouth daily before breakfast. 30 tablet 5   Ibuprofen 200 MG CAPS Take 200-400 mg by mouth 2 (two) times daily as needed  (pain). For abdominal pain     omeprazole (PRILOSEC) 40 MG capsule TAKE 1 CAPSULE BY MOUTH ONCE DAILY ON AN EMPTY STOMACH 1 HOUR PRIOR TO EATING OR DRINKING. 90 capsule 0   VITAMIN D PO Take 1 capsule by mouth daily.     Chlorphen-PE-Acetaminophen (NOREL AD) 4-10-325 MG TABS Take 1 tablet by mouth every 4 (four) hours as needed (nasal congestion, cold symptoms). 20 tablet 1   sertraline (ZOLOFT) 50 MG tablet Take 1 tablet (50 mg total) by mouth daily. 30 tablet 2   No facility-administered medications prior to visit.    Allergies  Allergen Reactions   Amoxicillin Rash   Penicillins Rash   Doxycycline Hives   Dicyclomine Rash   Latex Itching    ROS Review of Systems  Constitutional: Negative.   HENT: Negative.    Eyes: Negative.   Respiratory: Negative.    Cardiovascular: Negative.   Gastrointestinal:  Positive for diarrhea. Negative for abdominal distention, abdominal pain, anal bleeding, blood in stool, constipation, nausea, rectal pain and vomiting.  Endocrine: Negative.   Genitourinary:        Feels like urine doesn't empty completely or she has to strain to empty her bladder sometimes  Musculoskeletal: Negative.   Skin: Negative.   Allergic/Immunologic: Negative.   Neurological:  Positive for headaches.       -no headache today, but reports migraine 4 days per month  Hematological: Negative.   Psychiatric/Behavioral: Negative.       Objective:    Physical Exam Constitutional:      Appearance: Normal appearance.  HENT:     Head: Normocephalic and atraumatic.     Right Ear: Tympanic membrane, ear canal and external ear normal.     Left Ear: Tympanic membrane, ear canal and external ear normal.     Nose: Nose normal.     Mouth/Throat:     Mouth: Mucous membranes are moist.     Pharynx: Oropharynx is clear.  Eyes:     Extraocular Movements: Extraocular movements intact.     Conjunctiva/sclera: Conjunctivae normal.     Pupils: Pupils are equal, round, and reactive  to light.  Cardiovascular:     Rate and Rhythm: Normal rate and regular rhythm.     Pulses: Normal pulses.     Heart sounds: Normal heart sounds.  Pulmonary:     Effort: Pulmonary effort is normal.     Breath sounds: Normal breath sounds.  Abdominal:     General: Abdomen is flat. Bowel sounds are normal.     Palpations: Abdomen is soft.  Tenderness: There is abdominal tenderness.     Comments: Tenderness midline to lower abdomen (over her bladder)  Musculoskeletal:        General: Normal range of motion.     Cervical back: Normal range of motion and neck supple.  Skin:    General: Skin is warm and dry.     Capillary Refill: Capillary refill takes less than 2 seconds.  Neurological:     General: No focal deficit present.     Mental Status: She is alert and oriented to person, place, and time.     Cranial Nerves: No cranial nerve deficit.     Sensory: No sensory deficit.     Motor: No weakness.     Coordination: Coordination normal.     Gait: Gait normal.  Psychiatric:        Mood and Affect: Mood normal.        Behavior: Behavior normal.        Thought Content: Thought content normal.        Judgment: Judgment normal.    BP (!) 145/83 (BP Location: Right Arm, Patient Position: Sitting, Cuff Size: Large)   Pulse (!) 110   Temp 98.4 F (36.9 C)   Resp 20   Ht 5' 11.5" (1.816 m)   Wt 189 lb (85.7 kg)   SpO2 98%   BMI 25.99 kg/m  Wt Readings from Last 3 Encounters:  02/04/21 189 lb (85.7 kg)  12/30/20 167 lb (75.8 kg)  11/17/20 186 lb 1.6 oz (84.4 kg)     Health Maintenance Due  Topic Date Due   Pneumococcal Vaccine 80-36 Years old (1 - PCV) Never done   HPV VACCINES (1 - 2-dose series) Never done   PAP-Cervical Cytology Screening  03/27/2020   PAP SMEAR-Modifier  03/27/2020   COVID-19 Vaccine (3 - Booster for Pfizer series) 09/15/2020       Topic Date Due   HPV VACCINES (1 - 2-dose series) Never done    Lab Results  Component Value Date   TSH 1.570  02/01/2021   Lab Results  Component Value Date   WBC 5.6 02/01/2021   HGB 13.3 02/01/2021   HCT 39.6 02/01/2021   MCV 92 02/01/2021   PLT 222 02/01/2021   Lab Results  Component Value Date   NA 138 02/01/2021   K 4.5 02/01/2021   CO2 22 02/01/2021   GLUCOSE 78 02/01/2021   BUN 9 02/01/2021   CREATININE 0.65 02/01/2021   BILITOT 0.8 02/01/2021   ALKPHOS 62 02/01/2021   AST 19 02/01/2021   ALT 21 02/01/2021   PROT 7.6 02/01/2021   ALBUMIN 4.9 02/01/2021   CALCIUM 9.8 02/01/2021   ANIONGAP 5 08/25/2020   EGFR 125 02/01/2021   Lab Results  Component Value Date   CHOL 135 02/01/2021   Lab Results  Component Value Date   HDL 41 02/01/2021   Lab Results  Component Value Date   LDLCALC 84 02/01/2021   Lab Results  Component Value Date   TRIG 40 02/01/2021   Lab Results  Component Value Date   CHOLHDL 3.3 02/01/2021   Lab Results  Component Value Date   HGBA1C 5.0 02/01/2021      Assessment & Plan:   Problem List Items Addressed This Visit       Cardiovascular and Mediastinum   Migraine    -states she has 4 migraine days per month -Rx. Sumatriptan; for abortive therapy; she took this in the past and got  some relief, but it made her sleepy -Rx. Nurtec; for maintenance therapy       Relevant Medications   rizatriptan (MAXALT-MLT) 5 MG disintegrating tablet   escitalopram (LEXAPRO) 5 MG tablet   Rimegepant Sulfate (NURTEC) 75 MG TBDP     Other   Encounter for general adult medical examination with abnormal findings    -PE completed today       GAD (generalized anxiety disorder)    -continue buspar -she thinks that sertraline is cuasing diarrhea; STOP this d/t side effects -Rx. Lexapro; starting low dose since she was on low-dose sertraline and want lowest chance of causing side effects       Relevant Medications   rizatriptan (MAXALT-MLT) 5 MG disintegrating tablet   escitalopram (LEXAPRO) 5 MG tablet   Diarrhea    -has hx of IBS, but she  thinks that sertraline may be causing the issue;swapping to lexapro       Vitamin D deficiency    Last vitamin D Lab Results  Component Value Date   VD25OH 23.9 (L) 02/01/2021  -Rx. Vit D x 12 weeks -after 12 weeks take 5000 IU PO daily       Relevant Medications   Vitamin D, Ergocalciferol, (DRISDOL) 1.25 MG (50000 UNIT) CAPS capsule   Feeling of incomplete bladder emptying    -had mild pain to palpation over bladder -U/A today is negative -will employ watchful waiting; if this gets worse, return to clinic and may consider urology referral;        Meds ordered this encounter  Medications   Vitamin D, Ergocalciferol, (DRISDOL) 1.25 MG (50000 UNIT) CAPS capsule    Sig: Take 1 capsule (50,000 Units total) by mouth every 7 (seven) days.    Dispense:  12 capsule    Refill:  0   rizatriptan (MAXALT-MLT) 5 MG disintegrating tablet    Sig: Take 1 tablet (5 mg total) by mouth as needed for migraine. May repeat in 2 hours if needed    Dispense:  10 tablet    Refill:  0   escitalopram (LEXAPRO) 5 MG tablet    Sig: Take 1 tablet (5 mg total) by mouth daily.    Dispense:  90 tablet    Refill:  1   Rimegepant Sulfate (NURTEC) 75 MG TBDP    Sig: Take 1 tablet by mouth every other day. Do not take more than 1 dose in a 24 hour period    Dispense:  16 tablet    Refill:  5     Follow-up: Return in about 1 month (around 03/07/2021) for Med check (nurtec and lexapro).    Noreene Larsson, NP

## 2021-02-08 ENCOUNTER — Ambulatory Visit (INDEPENDENT_AMBULATORY_CARE_PROVIDER_SITE_OTHER): Payer: Self-pay | Admitting: Professional

## 2021-02-08 DIAGNOSIS — F4323 Adjustment disorder with mixed anxiety and depressed mood: Secondary | ICD-10-CM

## 2021-02-08 DIAGNOSIS — F411 Generalized anxiety disorder: Secondary | ICD-10-CM

## 2021-03-04 ENCOUNTER — Ambulatory Visit (INDEPENDENT_AMBULATORY_CARE_PROVIDER_SITE_OTHER): Payer: 59 | Admitting: Internal Medicine

## 2021-03-04 ENCOUNTER — Ambulatory Visit (INDEPENDENT_AMBULATORY_CARE_PROVIDER_SITE_OTHER): Payer: Self-pay | Admitting: Gastroenterology

## 2021-03-10 ENCOUNTER — Ambulatory Visit: Payer: Self-pay | Admitting: Nurse Practitioner

## 2021-03-10 ENCOUNTER — Encounter (INDEPENDENT_AMBULATORY_CARE_PROVIDER_SITE_OTHER): Payer: Self-pay

## 2021-03-11 ENCOUNTER — Ambulatory Visit (INDEPENDENT_AMBULATORY_CARE_PROVIDER_SITE_OTHER): Payer: Self-pay | Admitting: Gastroenterology

## 2021-03-25 ENCOUNTER — Encounter: Payer: Self-pay | Admitting: Emergency Medicine

## 2021-03-25 ENCOUNTER — Other Ambulatory Visit: Payer: Self-pay

## 2021-03-25 ENCOUNTER — Ambulatory Visit
Admission: EM | Admit: 2021-03-25 | Discharge: 2021-03-25 | Disposition: A | Discharge status: Home / Self Care | Payer: Self-pay | Attending: Emergency Medicine | Admitting: Emergency Medicine

## 2021-03-25 DIAGNOSIS — J069 Acute upper respiratory infection, unspecified: Secondary | ICD-10-CM

## 2021-03-25 DIAGNOSIS — R509 Fever, unspecified: Secondary | ICD-10-CM

## 2021-03-25 MED ORDER — BENZONATATE 100 MG PO CAPS: 100.0000 mg | ORAL_CAPSULE | Freq: Three times a day (TID) | ORAL | 0 refills | Status: DC

## 2021-03-25 NOTE — Discharge Instructions (Signed)

## 2021-03-25 NOTE — ED Triage Notes (Signed)
Chills, congestion, body aches. S/s started on Monday.  Last took tylenol at 0930 today

## 2021-03-25 NOTE — ED Provider Notes (Signed)
[facility]   [id number] 03/25/21 Arrival Time: 1111   CC: COVID symptoms  SUBJECTIVE: History from: patient.  Kim [name] is a 25 y.o. female who presents with chills, congestion, body aches x 3 days ago.  Denies sick exposure to COVID, flu or strep.  Has tried tylenol without relief.  Denies aggravating factors.  Denies previous symptoms in the past.   Denies SOB, wheezing, chest pain, nausea, changes in bowel or bladder habits.    ROS: As per HPI.  All other pertinent ROS negative.     Past Medical History:  Diagnosis Date   ADHD (attention deficit hyperactivity disorder)    Anxiety    Depression    Encounter for gynecological examination with Papanicolaou smear of cervix 03/27/2017   Encounter for surveillance of contraceptive pills 03/27/2017   GERD (gastroesophageal reflux disease)    Migraines    Past Surgical History:  Procedure Laterality Date   BIOPSY  01/06/2021   Procedure: BIOPSY;  Surgeon: Malissa Hippo, MD;  Location: [facility];  Service: Endoscopy;;   COLONOSCOPY WITH PROPOFOL N/A 01/06/2021   Procedure: COLONOSCOPY WITH PROPOFOL;  Surgeon: Malissa Hippo, MD;  Location: [facility];  Service: Endoscopy;  Laterality: N/A;  1:20   ESOPHAGOGASTRODUODENOSCOPY (EGD) WITH PROPOFOL N/A 01/06/2021   Procedure: ESOPHAGOGASTRODUODENOSCOPY (EGD) WITH PROPOFOL;  Surgeon: Malissa Hippo, MD;  Location: [facility];  Service: Endoscopy;  Laterality: N/A;   Allergies  Allergen Reactions   Amoxicillin Rash   Penicillins Rash   Doxycycline Hives   Dicyclomine Rash   Latex Itching   No current facility-administered medications on file prior to encounter.   Current Outpatient Medications on File Prior to Encounter  Medication Sig Dispense Refill   acetaminophen (TYLENOL) 500 MG tablet Take 500-1,000 mg by mouth every 6 (six) hours as needed for moderate pain or headache.     Ascorbic Acid (VITAMIN C PO) Take 1 tablet by mouth daily.      busPIRone (BUSPAR) 10 MG tablet Take 0.5 tablets (5 mg total) by mouth 3 (three) times daily. 90 tablet 1   Cyanocobalamin (B-12 PO) Take 1 tablet by mouth daily.     escitalopram (LEXAPRO) 5 MG tablet Take 1 tablet (5 mg total) by mouth daily. 90 tablet 1   fluticasone (FLONASE) 50 MCG/ACT nasal spray Use 2 sprays in each nostril twice daily for a week. After 1 week, decrease to 1 spray in each nostril twice daily  as needed for congestion/allergies. 16 g 6   hyoscyamine (LEVBID) 0.375 MG 12 hr tablet Take 1 tablet (0.375 mg total) by mouth daily before breakfast. 30 tablet 5   Ibuprofen 200 MG CAPS Take 200-400 mg by mouth 2 (two) times daily as needed (pain). For abdominal pain     omeprazole (PRILOSEC) 40 MG capsule TAKE 1 CAPSULE BY MOUTH ONCE DAILY ON AN EMPTY STOMACH 1 HOUR PRIOR TO EATING OR DRINKING. 90 capsule 0   Rimegepant Sulfate (NURTEC) 75 MG TBDP Take 1 tablet by mouth every other day. Do not take more than 1 dose in a 24 hour period 16 tablet 5   rizatriptan (MAXALT-MLT) 5 MG disintegrating tablet Dissolve 1 tablet (5 mg total) in mouth as needed for migraine. May repeat in 2 hours if needed. 10 tablet 0   VITAMIN D PO Take 1 capsule by mouth daily.     Vitamin D, Ergocalciferol, (DRISDOL) 1.25 MG (50000 UNIT) CAPS capsule Take 1 capsule (50,000 Units total) by  mouth every 7 (seven) days. 12 capsule 0   Social History   Socioeconomic History   Marital status: Single    Spouse name: Not on file   Number of children: Not on file   Years of education: Not on file   Highest education level: Associate degree: occupational, Scientist, product/process development, or vocational program  Occupational History   Not on file  Tobacco Use   Smoking status: Never   Smokeless tobacco: Never  Vaping Use   Vaping Use: Never used  Substance and Sexual Activity   Alcohol use: Yes    Comment: monthly   Drug use: No   Sexual activity: Yes    Birth control/protection: I.U.D.  Other Topics Concern   Not on file   Social History Narrative   Lives with mom, step dad      Pets: 3 dogs: Skimp, Ginger, and Evie May      Enjoys: read-genre: poetry, movies and music      Diet: eats all food groups, snacks with stress   Caffeine: coffee-2 cups; tea daily, mountain dew    Water: 1-2 cups daily       Does not wear seat belt due to being anxiety    Does not use phone while driving   Psychologist, sport and exercise at home    Social Determinants of Health   Financial Resource Strain: Not on file  Food Insecurity: Not on file  Transportation Needs: Not on file  Physical Activity: Not on file  Stress: Not on file  Social Connections: Not on file  Intimate Partner Violence: Not on file   Family History  Problem Relation Age of Onset   Cancer Paternal Grandfather    Alzheimer's disease Paternal Grandmother    Cancer Maternal Grandmother        breast    Cancer Maternal Grandfather        pancreatic and bone   Cancer Father        lung   Hypertension Mother     OBJECTIVE:  Vitals:   03/25/21 1140  BP: 137/85  Pulse: (!) 106  Resp: 18  Temp: (!) 100.7 F (38.2 C)  TempSrc: Oral  SpO2: 98%     General appearance: alert; appears fatigued, but nontoxic; speaking in full sentences and tolerating own secretions HEENT: NCAT; Ears: EACs clear, TMs pearly gray; Eyes: PERRL.  EOM grossly intact. Nose: nares patent without rhinorrhea, Throat: oropharynx clear, tonsils non erythematous or enlarged, uvula midline  Neck: supple without LAD Lungs: unlabored respirations, symmetrical air entry; cough: mild; no respiratory distress; CTAB Heart: regular rate and rhythm.   Skin: warm and dry Psychological: alert and cooperative; normal mood and affect  ASSESSMENT & PLAN:  1. Fever, unspecified   2. Viral URI with cough     Meds ordered this encounter  Medications   benzonatate (TESSALON) 100 MG capsule    Sig: Take 1 capsule (100 mg total) by mouth every 8 (eight) hours.    Dispense:  21 capsule     Refill:  0    Order Specific Question:   Supervising Provider    Answer:   Eustace Moore [[id number]]    COVID testing ordered.  It will take between 5-7 days for test results.  Someone will contact you regarding abnormal results.    In the meantime: You should remain isolated in your home for 5 days from symptom onset AND greater than 72 hours after symptoms resolution (absence of fever without the use of fever-reducing  medication and improvement in respiratory symptoms), whichever is longer Get plenty of rest and push fluids Tessalon Perles prescribed for cough Use OTC zyrtec for nasal congestion, runny nose, and/or sore throat Use OTC flonase for nasal congestion and runny nose Use medications daily for symptom relief Use OTC medications like ibuprofen or tylenol as needed fever or pain Call or go to the ED if you have any new or worsening symptoms such as fever, worsening cough, shortness of breath, chest tightness, chest pain, turning blue, changes in mental status, etc...   Reviewed expectations re: course of current medical issues. Questions answered. Outlined signs and symptoms indicating need for more acute intervention. Patient verbalized understanding. After Visit Summary given.          Rennis Harding, PA-C 03/25/21 1159

## 2021-03-26 ENCOUNTER — Encounter (HOSPITAL_COMMUNITY): Payer: Self-pay

## 2021-03-26 ENCOUNTER — Emergency Department (HOSPITAL_COMMUNITY)
Admission: EM | Admit: 2021-03-26 | Discharge: 2021-03-27 | Disposition: A | Discharge status: Home / Self Care | Payer: Self-pay | Attending: Emergency Medicine | Admitting: Emergency Medicine

## 2021-03-26 ENCOUNTER — Emergency Department (HOSPITAL_COMMUNITY): Payer: Self-pay

## 2021-03-26 ENCOUNTER — Other Ambulatory Visit: Payer: Self-pay

## 2021-03-26 DIAGNOSIS — Z91040 Latex allergy status: Secondary | ICD-10-CM | POA: Insufficient documentation

## 2021-03-26 DIAGNOSIS — R000 Tachycardia, unspecified: Secondary | ICD-10-CM | POA: Insufficient documentation

## 2021-03-26 DIAGNOSIS — U071 COVID-19: Secondary | ICD-10-CM | POA: Insufficient documentation

## 2021-03-26 LAB — COVID-19, FLU A+B NAA
Influenza A, NAA: NOT DETECTED
Influenza B, NAA: NOT DETECTED
SARS-CoV-2, NAA: DETECTED — AB

## 2021-03-26 LAB — BASIC METABOLIC PANEL
Anion gap: 11 (ref 5–15)
BUN: 6 mg/dL (ref 6–20)
CO2: 18 mmol/L — ABNORMAL LOW (ref 22–32)
Calcium: 8.7 mg/dL — ABNORMAL LOW (ref 8.9–10.3)
Chloride: 107 mmol/L (ref 98–111)
Creatinine, Ser: 0.63 mg/dL (ref 0.44–1.00)
GFR, Estimated: >60 mL/min (ref >60)
Glucose, Bld: 98 mg/dL (ref 70–99)
Potassium: 3.2 mmol/L — ABNORMAL LOW (ref 3.5–5.1)
Sodium: 136 mmol/L (ref 135–145)

## 2021-03-26 LAB — CBC WITH DIFFERENTIAL/PLATELET
Abs Immature Granulocytes: 0.02 10*3/uL (ref 0.00–0.07)
Basophils Absolute: 0 10*3/uL (ref 0.0–0.1)
Basophils Relative: 0 %
Eosinophils Absolute: 0 10*3/uL (ref 0.0–0.5)
Eosinophils Relative: 0 %
HCT: 40.7 % (ref 36.0–46.0)
Hemoglobin: 14.3 g/dL (ref 12.0–15.0)
Immature Granulocytes: 0 %
Lymphocytes Relative: 24 %
Lymphs Abs: 1.3 10*3/uL (ref 0.7–4.0)
MCH: 31.8 pg (ref 26.0–34.0)
MCHC: 35.1 g/dL (ref 30.0–36.0)
MCV: 90.4 fL (ref 80.0–100.0)
Monocytes Absolute: 0.6 10*3/uL (ref 0.1–1.0)
Monocytes Relative: 11 %
Neutro Abs: 3.4 10*3/uL (ref 1.7–7.7)
Neutrophils Relative %: 65 %
Platelets: 189 10*3/uL (ref 150–400)
RBC: 4.5 MIL/uL (ref 3.87–5.11)
RDW: 11.9 % (ref 11.5–15.5)
WBC: 5.3 10*3/uL (ref 4.0–10.5)
nRBC: 0 % (ref 0.0–0.2)

## 2021-03-26 LAB — I-STAT BETA HCG BLOOD, ED (MC, WL, AP ONLY): I-stat hCG, quantitative: <5 m[IU]/mL (ref <5)

## 2021-03-26 MED ORDER — IOHEXOL 350 MG/ML SOLN
75.0000 mL | Freq: Once | INTRAVENOUS | Status: AC | PRN
  Administered 2021-03-26: 75 mL via INTRAVENOUS

## 2021-03-26 MED ORDER — ALBUTEROL SULFATE HFA 108 (90 BASE) MCG/ACT IN AERS
2.0000 | INHALATION_SPRAY | Freq: Four times a day (QID) | RESPIRATORY_TRACT | Status: DC
  Administered 2021-03-26: 2 via RESPIRATORY_TRACT
  Filled 2021-03-26: qty 6.7

## 2021-03-26 NOTE — ED Triage Notes (Signed)
Pt recently diagnosed from [facility] with Covid. Pt tachycardia and febrile. Pt reports productive cough with chest congestion and tightness.

## 2021-03-26 NOTE — ED Provider Notes (Addendum)
Kim [name]   CSN: [id number] Arrival date & time: 03/26/21  2015     History Chief Complaint  Patient presents with   Covid Positive    Kim [name] is a 25 y.o. female.  Patient seen by urgent care and referred in.  Patient symptomatic with now proven a COVID infection on Wednesday.  Patient with fever temp 100.3 tachycardia heart rate 124 tachypneic with respiratory rate of 24 oxygen saturations 99% 200%.  Blood pressure 133/86.  Patient with productive cough and congestion and fever.      Past Medical History:  Diagnosis Date   ADHD (attention deficit hyperactivity disorder)    Anxiety    Depression    Encounter for gynecological examination with Papanicolaou smear of cervix 03/27/2017   Encounter for surveillance of contraceptive pills 03/27/2017   GERD (gastroesophageal reflux disease)    Migraines     Patient Active Problem List   Diagnosis Date Noted   Migraine 02/04/2021   Vitamin D deficiency 02/04/2021   Feeling of incomplete bladder emptying 02/04/2021   Unspecified abdominal pain 11/17/2020   Prehypertension 11/05/2020   Diarrhea 07/23/2020   Severe episode of recurrent major depressive disorder, without psychotic features (HCC) 06/24/2020   GAD (generalized anxiety disorder) 06/24/2020   Indigestion 06/24/2020   Need for immunization against influenza 06/24/2020   Alcohol consumption binge drinking 06/24/2020   Panic attacks 12/05/2019   Encounter for general adult medical examination with abnormal findings 03/27/2017    Past Surgical History:  Procedure Laterality Date   BIOPSY  01/06/2021   Procedure: BIOPSY;  Surgeon: Malissa Hippo, MD;  Location: [facility];  Service: Endoscopy;;   COLONOSCOPY WITH PROPOFOL N/A 01/06/2021   Procedure: COLONOSCOPY WITH PROPOFOL;  Surgeon: Malissa Hippo, MD;  Location: [facility];  Service: Endoscopy;  Laterality: N/A;  1:20   ESOPHAGOGASTRODUODENOSCOPY (EGD)  WITH PROPOFOL N/A 01/06/2021   Procedure: ESOPHAGOGASTRODUODENOSCOPY (EGD) WITH PROPOFOL;  Surgeon: Malissa Hippo, MD;  Location: [facility];  Service: Endoscopy;  Laterality: N/A;     OB History     Gravida  0   Para  0   Term  0   Preterm  0   AB  0   Living  0      SAB  0   IAB  0   Ectopic  0   Multiple  0   Live Births  0           Family History  Problem Relation Age of Onset   Cancer Paternal Grandfather    Alzheimer's disease Paternal Grandmother    Cancer Maternal Grandmother        breast    Cancer Maternal Grandfather        pancreatic and bone   Cancer Father        lung   Hypertension Mother     Social History   Tobacco Use   Smoking status: Never   Smokeless tobacco: Never  Vaping Use   Vaping Use: Never used  Substance Use Topics   Alcohol use: Yes    Comment: monthly   Drug use: No    Home Medications Prior to Admission medications   Medication Sig Start Date End Date Taking? Authorizing Provider  acetaminophen (TYLENOL) 500 MG tablet Take 500-1,000 mg by mouth every 6 (six) hours as needed for moderate pain or headache.    [provider]  Ascorbic Acid (VITAMIN C PO) Take  1 tablet by mouth daily.    [provider]  benzonatate (TESSALON) 100 MG capsule Take 1 capsule (100 mg total) by mouth every 8 (eight) hours. 03/25/21   Wurst, [place], PA-C  busPIRone (BUSPAR) 10 MG tablet Take 0.5 tablets (5 mg total) by mouth 3 (three) times daily. 06/24/20   Freddy Finner, NP  Cyanocobalamin (B-12 PO) Take 1 tablet by mouth daily.    [provider]  escitalopram (LEXAPRO) 5 MG tablet Take 1 tablet (5 mg total) by mouth daily. 02/04/21   Heather Roberts, NP  fluticasone [facility]) 50 MCG/ACT nasal spray Use 2 sprays in each nostril twice daily for a week. After 1 week, decrease to 1 spray in each nostril twice daily  as needed for congestion/allergies. 10/27/20   Heather Roberts, NP  hyoscyamine (LEVBID)  0.375 MG 12 hr tablet Take 1 tablet (0.375 mg total) by mouth daily before breakfast. 01/13/21   Rehman, Joline Maxcy, MD  Ibuprofen 200 MG CAPS Take 200-400 mg by mouth 2 (two) times daily as needed (pain). For abdominal pain    [provider]  omeprazole (PRILOSEC) 40 MG capsule TAKE 1 CAPSULE BY MOUTH ONCE DAILY ON AN EMPTY STOMACH 1 HOUR PRIOR TO EATING OR DRINKING. 06/24/20 06/24/21  Kerri Perches, MD  Rimegepant Sulfate (NURTEC) 75 MG TBDP Take 1 tablet by mouth every other day. Do not take more than 1 dose in a 24 hour period 02/04/21   Heather Roberts, NP  rizatriptan (MAXALT-MLT) 5 MG disintegrating tablet Dissolve 1 tablet (5 mg total) in mouth as needed for migraine. May repeat in 2 hours if needed. 02/04/21   Heather Roberts, NP  VITAMIN D PO Take 1 capsule by mouth daily.    [provider]  Vitamin D, Ergocalciferol, (DRISDOL) 1.25 MG (50000 UNIT) CAPS capsule Take 1 capsule (50,000 Units total) by mouth every 7 (seven) days. 02/04/21   Heather Roberts, NP    Allergies    Amoxicillin, Penicillins, Doxycycline, Dicyclomine, and Latex  Review of Systems   Review of Systems  Constitutional:  Positive for fever. Negative for chills.  HENT:  Positive for congestion. Negative for ear pain and sore throat.   Eyes:  Negative for pain and visual disturbance.  Respiratory:  Positive for cough and shortness of breath.   Cardiovascular:  Negative for chest pain and palpitations.  Gastrointestinal:  Negative for abdominal pain and vomiting.  Genitourinary:  Negative for dysuria and hematuria.  Musculoskeletal:  Negative for arthralgias and back pain.  Skin:  Negative for color change and rash.  Neurological:  Negative for seizures and syncope.  All other systems reviewed and are negative.  Physical Exam Updated Vital Signs BP 140/82   Pulse (!) 118   Temp 100.3 F (37.9 C) (Oral)   Resp (!) 23   Ht 1.816 m (5' 11.5")   Wt 79.4 kg   LMP 03/04/2021 (Approximate)   SpO2  100%   BMI 24.07 kg/m   Physical Exam Vitals and nursing [name] reviewed.  Constitutional:      General: She is not in acute distress.    Appearance: Normal appearance. She is well-developed. She is ill-appearing.  HENT:     Head: Normocephalic and atraumatic.  Eyes:     Extraocular Movements: Extraocular movements intact.     Conjunctiva/sclera: Conjunctivae normal.     Pupils: Pupils are equal, round, and reactive to light.  Cardiovascular:     Rate and Rhythm:  Regular rhythm. Tachycardia present.     Heart sounds: No murmur heard. Pulmonary:     Effort: Pulmonary effort is normal. No respiratory distress.     Breath sounds: Normal breath sounds. No stridor. No wheezing, rhonchi or rales.  Abdominal:     Palpations: Abdomen is soft.     Tenderness: There is no abdominal tenderness.  Musculoskeletal:        General: No swelling. Normal range of motion.     Cervical back: Neck supple.  Skin:    General: Skin is warm and dry.  Neurological:     General: No focal deficit present.     Mental Status: She is alert and oriented to person, place, and time.    ED Results / Procedures / Treatments   Labs (all labs ordered are listed, but only abnormal results are displayed) Labs Reviewed  CBC WITH DIFFERENTIAL/PLATELET  BASIC METABOLIC PANEL  I-STAT BETA HCG BLOOD, ED ([facility], [facility], [facility] ONLY)    EKG EKG Interpretation  Date/Time:  Friday March 26 2021 20:49:01 EDT Ventricular Rate:  134 PR Interval:  128 QRS Duration: 101 QT Interval:  281 QTC Calculation: 420 R Axis:   117 Text Interpretation: Sinus tachycardia Probable left atrial enlargement Right axis deviation Low voltage, precordial leads Borderline repolarization abnormality Confirmed by Vanetta Mulders [phone]) on 03/26/2021 8:56:46 PM  Radiology DG Chest Port 1 View  Result Date: 03/26/2021 CLINICAL DATA:  COVID.  Cough. EXAM: PORTABLE CHEST 1 VIEW COMPARISON:  Chest x-ray 08/25/2020. FINDINGS: The heart size and  mediastinal contours are within normal limits. Both lungs are clear. The visualized skeletal structures are unremarkable. IMPRESSION: No active disease. Electronically Signed   By: Darliss Cheney M.D.   On: 03/26/2021 21:27    Procedures Procedures   Medications Ordered in ED Medications  albuterol (VENTOLIN HFA) 108 (90 Base) MCG/ACT inhaler 2 puff (has no administration in time range)    ED Course  I have reviewed the triage vital signs and the nursing notes.  Pertinent labs & imaging results that were available during my care of the patient were reviewed by me and considered in my medical decision making (see chart for details).    MDM Rules/Calculators/A&P                          Patient tachycardic feeling very air hunger.  Chest x-ray negative CBC without acute findings.  Chest x-ray shows no active disease.   EKG showing sinus tachycardia.  Patient's lungs are clear on exam as well.  We will treat her with albuterol inhaler get CT angio chest to rule out pulmonary embolus.  Patient's past medical history is significant for migraines attention deficit disorder.  But no complicating factors that would plan for patient to be treated with antivirals.  CT chest without evidence of pulmonary embolus no evidence of any pneumonia.  Patient's oxygen saturations are fine.  We will treat symptomatically with albuterol inhaler and Tylenol for fever as well as the medications that her primary care provider had prescribed.  Patient stable for discharge home.  Final Clinical Impression(s) / ED Diagnoses Final diagnoses:  COVID    Rx / DC Orders ED Discharge Orders     None        Vanetta Mulders, MD 03/26/21 0488    Vanetta Mulders, MD 03/27/21 0030

## 2021-03-26 NOTE — Discharge Instructions (Addendum)
Use your albuterol inhaler 2 puffs every 6 hours.  Take Tylenol for the fever.  Over-the-counter cold and cough medicines are okay as well.  CT scan showed no evidence of pneumonia showed no evidence of any blood clots in the lungs.  Work note provided to be out of work.  Return for any new or worse symptoms.  Return for worsening shortness of breath.  Oxygen saturations were fine here today.

## 2021-03-27 NOTE — ED Notes (Signed)
Patient transported to CT 

## 2021-03-28 ENCOUNTER — Encounter: Payer: Self-pay | Admitting: Nurse Practitioner

## 2021-03-29 ENCOUNTER — Other Ambulatory Visit: Payer: Self-pay

## 2021-03-29 ENCOUNTER — Ambulatory Visit (INDEPENDENT_AMBULATORY_CARE_PROVIDER_SITE_OTHER): Payer: Self-pay | Admitting: Nurse Practitioner

## 2021-03-29 ENCOUNTER — Encounter: Payer: Self-pay | Admitting: Nurse Practitioner

## 2021-03-29 VITALS — Temp 100.2°F

## 2021-03-29 DIAGNOSIS — U071 COVID-19: Secondary | ICD-10-CM

## 2021-03-29 MED ORDER — HYDROCOD POLST-CPM POLST ER 10-8 MG/5ML PO SUER: 5.0000 mL | Freq: Two times a day (BID) | ORAL | 0 refills | Status: DC | PRN

## 2021-03-29 MED ORDER — NIRMATRELVIR/RITONAVIR (PAXLOVID)TABLET: 3.0000 | ORAL_TABLET | Freq: Two times a day (BID) | ORAL | 0 refills | Status: AC

## 2021-03-29 MED ORDER — PREDNISONE 20 MG PO TABS: 40.0000 mg | ORAL_TABLET | Freq: Every day | ORAL | 0 refills | Status: DC

## 2021-03-29 NOTE — Progress Notes (Signed)
Acute Office Visit  Subjective:    Patient ID: Kim [name], female    DOB: 1996-01-14, 25 y.o.   MRN: [id number]  Chief Complaint  Patient presents with   Covid Positive    Tested positive at Urgent Care Thursday. Symptoms started Wednesday x5 days ago. Sinus congestion, cough, fever up to 103.5, vomiting and diarrhea. Went to the ER Friday for sob, they were going to admit her but changed their mind last minute.     HPI Patient is in today for COVID-19. She has taken tylenol and ibuprofen. She was discharged with albuterol.  Past Medical History:  Diagnosis Date   ADHD (attention deficit hyperactivity disorder)    Anxiety    Depression    Encounter for gynecological examination with Papanicolaou smear of cervix 03/27/2017   Encounter for surveillance of contraceptive pills 03/27/2017   GERD (gastroesophageal reflux disease)    Migraines     Past Surgical History:  Procedure Laterality Date   BIOPSY  01/06/2021   Procedure: BIOPSY;  Surgeon: Rogene Houston, MD;  Location: [facility];  Service: Endoscopy;;   COLONOSCOPY WITH PROPOFOL N/A 01/06/2021   Procedure: COLONOSCOPY WITH PROPOFOL;  Surgeon: Rogene Houston, MD;  [facility]: [facility];  Service: Endoscopy;  Laterality: N/A;  1:20   ESOPHAGOGASTRODUODENOSCOPY (EGD) WITH PROPOFOL N/A 01/06/2021   Procedure: ESOPHAGOGASTRODUODENOSCOPY (EGD) WITH PROPOFOL;  Surgeon: Rogene Houston, MD;  Location: [facility];  Service: Endoscopy;  Laterality: N/A;    Family History  Problem Relation Age of Onset   Cancer Paternal Grandfather    Alzheimer's disease Paternal Grandmother    Cancer Maternal Grandmother        breast    Cancer Maternal Grandfather        pancreatic and bone   Cancer Father        lung   Hypertension Mother     Social History   Socioeconomic History   Marital status: Single    Spouse name: Not on file   Number of children: Not on file   Years of education: Not on file   Highest  education level: Associate degree: occupational, Hotel manager, or vocational program  Occupational History   Not on file  Tobacco Use   Smoking status: Never   Smokeless tobacco: Never  Vaping Use   Vaping Use: Never used  Substance and Sexual Activity   Alcohol use: Yes    Comment: monthly   Drug use: No   Sexual activity: Yes    Birth control/protection: I.U.D.  Other Topics Concern   Not on file  Social History Narrative   Lives with mom, step dad      Pets: 3 dogs: Skimp, Ginger, and Evie May      Enjoys: read-genre: poetry, movies and music      Diet: eats all food groups, snacks with stress   Caffeine: coffee-2 cups; tea daily, mountain dew    Water: 1-2 cups daily       Does not wear seat belt due to being anxiety    Does not use phone while driving   Oceanographer at home    Social Determinants of Health   Financial Resource Strain: Not on file  Food Insecurity: Not on file  Transportation Needs: Not on file  Physical Activity: Not on file  Stress: Not on file  Social Connections: Not on file  Intimate Partner Violence: Not on file    Outpatient Medications Prior to Visit  Medication Sig Dispense Refill   acetaminophen (TYLENOL) 500 MG tablet Take 500-1,000 mg by mouth every 6 (six) hours as needed for moderate pain or headache.     Ascorbic Acid (VITAMIN C PO) Take 1 tablet by mouth daily.     benzonatate (TESSALON) 100 MG capsule Take 1 capsule (100 mg total) by mouth every 8 (eight) hours. 21 capsule 0   busPIRone (BUSPAR) 10 MG tablet Take 0.5 tablets (5 mg total) by mouth 3 (three) times daily. 90 tablet 1   Cyanocobalamin (B-12 PO) Take 1 tablet by mouth daily.     escitalopram (LEXAPRO) 5 MG tablet Take 1 tablet (5 mg total) by mouth daily. 90 tablet 1   fluticasone (FLONASE) 50 MCG/ACT nasal spray Use 2 sprays in each nostril twice daily for a week. After 1 week, decrease to 1 spray in each nostril twice daily  as needed for congestion/allergies. 16 g  6   hyoscyamine (LEVBID) 0.375 MG 12 hr tablet Take 1 tablet (0.375 mg total) by mouth daily before breakfast. 30 tablet 5   Ibuprofen 200 MG CAPS Take 200-400 mg by mouth 2 (two) times daily as needed (pain). For abdominal pain     omeprazole (PRILOSEC) 40 MG capsule TAKE 1 CAPSULE BY MOUTH ONCE DAILY ON AN EMPTY STOMACH 1 HOUR PRIOR TO EATING OR DRINKING. 90 capsule 0   Rimegepant Sulfate (NURTEC) 75 MG TBDP Take 1 tablet by mouth every other day. Do not take more than 1 dose in a 24 hour period 16 tablet 5   rizatriptan (MAXALT-MLT) 5 MG disintegrating tablet Dissolve 1 tablet (5 mg total) in mouth as needed for migraine. May repeat in 2 hours if needed. 10 tablet 0   VITAMIN D PO Take 1 capsule by mouth daily.     Vitamin D, Ergocalciferol, (DRISDOL) 1.25 MG (50000 UNIT) CAPS capsule Take 1 capsule (50,000 Units total) by mouth every 7 (seven) days. 12 capsule 0   No facility-administered medications prior to visit.    Allergies  Allergen Reactions   Amoxicillin Rash   Penicillins Rash   Doxycycline Hives   Dicyclomine Rash   Latex Itching    Review of Systems  Constitutional:  Positive for fatigue and fever.  HENT:  Positive for congestion.   Respiratory:  Positive for cough and shortness of breath.       Objective:    Physical Exam  Temp 100.2 F (37.9 C)   LMP 03/04/2021 (Approximate)  Wt Readings from Last 3 Encounters:  03/26/21 175 lb (79.4 kg)  02/04/21 189 lb (85.7 kg)  12/30/20 167 lb (75.8 kg)    Health Maintenance Due  Topic Date Due   HPV VACCINES (1 - 2-dose series) Never done   PAP-Cervical Cytology Screening  03/27/2020   PAP SMEAR-Modifier  03/27/2020   COVID-19 Vaccine (3 - Booster for Pfizer series) 09/15/2020   INFLUENZA VACCINE  02/08/2021       Topic Date Due   HPV VACCINES (1 - 2-dose series) Never done     Lab Results  Component Value Date   TSH 1.570 02/01/2021   Lab Results  Component Value Date   WBC 5.3 03/26/2021   HGB 14.3  03/26/2021   HCT 40.7 03/26/2021   MCV 90.4 03/26/2021   PLT 189 03/26/2021   Lab Results  Component Value Date   NA 136 03/26/2021   K 3.2 (L) 03/26/2021   CO2 18 (L) 03/26/2021   GLUCOSE 98 03/26/2021   BUN  6 03/26/2021   CREATININE 0.63 03/26/2021   BILITOT 0.8 02/01/2021   ALKPHOS 62 02/01/2021   AST 19 02/01/2021   ALT 21 02/01/2021   PROT 7.6 02/01/2021   ALBUMIN 4.9 02/01/2021   CALCIUM 8.7 (L) 03/26/2021   ANIONGAP 11 03/26/2021   EGFR 125 02/01/2021   Lab Results  Component Value Date   CHOL 135 02/01/2021   Lab Results  Component Value Date   HDL 41 02/01/2021   Lab Results  Component Value Date   LDLCALC 84 02/01/2021   Lab Results  Component Value Date   TRIG 40 02/01/2021   Lab Results  Component Value Date   CHOLHDL 3.3 02/01/2021   Lab Results  Component Value Date   HGBA1C 5.0 02/01/2021       Assessment & Plan:   Problem List Items Addressed This Visit       Other   COVID-19 - Primary    -still symptomatic despite using albuterol, ibuprofen, and tylenol -Rx. Prednisone, tussionex, and paxlovid      Relevant Medications   nirmatrelvir/ritonavir EUA (PAXLOVID) 20 x 150 MG & 10 x 100MG TABS   predniSONE (DELTASONE) 20 MG tablet   chlorpheniramine-HYDROcodone (TUSSIONEX PENNKINETIC ER) 10-8 MG/5ML SUER     Meds ordered this encounter  Medications   nirmatrelvir/ritonavir EUA (PAXLOVID) 20 x 150 MG & 10 x 100MG TABS    Sig: Take 3 tablets by mouth 2 (two) times daily for 5 days. (Take nirmatrelvir 150 mg two tablets twice daily for 5 days and ritonavir 100 mg one tablet twice daily for 5 days) Patient GFR is > 60.    Dispense:  30 tablet    Refill:  0   predniSONE (DELTASONE) 20 MG tablet    Sig: Take 2 tablets (40 mg total) by mouth daily with breakfast.    Dispense:  10 tablet    Refill:  0   chlorpheniramine-HYDROcodone (TUSSIONEX PENNKINETIC ER) 10-8 MG/5ML SUER    Sig: Take 5 mLs by mouth every 12 (twelve) hours as needed  for cough.    Dispense:  115 mL    Refill:  0   Date:  03/29/2021   Location of Patient: Home Location of Provider: Office Consent was obtain for visit to be over via telehealth. I verified that I am speaking with the correct person using two identifiers.  I connected with  Chinita Pester on 03/29/21 via telephone and verified that I am speaking with the correct person using two identifiers.   I discussed the limitations of evaluation and management by telemedicine. The patient expressed understanding and agreed to proceed.  Time spent: 7 min   Noreene Larsson, NP

## 2021-03-29 NOTE — Assessment & Plan Note (Signed)
-  still symptomatic despite using albuterol, ibuprofen, and tylenol -Rx. Prednisone, tussionex, and paxlovid

## 2021-04-05 ENCOUNTER — Ambulatory Visit: Payer: Self-pay | Admitting: Nurse Practitioner

## 2021-04-12 ENCOUNTER — Ambulatory Visit (INDEPENDENT_AMBULATORY_CARE_PROVIDER_SITE_OTHER): Payer: Self-pay | Admitting: Gastroenterology

## 2021-06-02 ENCOUNTER — Ambulatory Visit: Payer: Self-pay | Admitting: Nurse Practitioner

## 2021-11-17 ENCOUNTER — Ambulatory Visit: Payer: Self-pay | Admitting: Internal Medicine

## 2021-11-17 ENCOUNTER — Encounter: Payer: Self-pay | Admitting: Internal Medicine

## 2021-11-17 VITALS — BP 128/82 | HR 92 | Resp 18 | Ht 71.0 in | Wt 174.0 lb

## 2021-11-17 DIAGNOSIS — F411 Generalized anxiety disorder: Secondary | ICD-10-CM

## 2021-11-17 DIAGNOSIS — M7989 Other specified soft tissue disorders: Secondary | ICD-10-CM | POA: Insufficient documentation

## 2021-11-17 DIAGNOSIS — G43009 Migraine without aura, not intractable, without status migrainosus: Secondary | ICD-10-CM

## 2021-11-17 DIAGNOSIS — E559 Vitamin D deficiency, unspecified: Secondary | ICD-10-CM

## 2021-11-17 DIAGNOSIS — R8290 Unspecified abnormal findings in urine: Secondary | ICD-10-CM | POA: Insufficient documentation

## 2021-11-17 DIAGNOSIS — Z0000 Encounter for general adult medical examination without abnormal findings: Secondary | ICD-10-CM

## 2021-11-17 MED ORDER — SUMATRIPTAN SUCCINATE 100 MG PO TABS: 100.0000 mg | ORAL_TABLET | ORAL | 2 refills | Status: DC | PRN

## 2021-11-17 MED ORDER — BUPROPION HCL ER (XL) 150 MG PO TB24: 150.0000 mg | ORAL_TABLET | Freq: Every day | ORAL | 2 refills | Status: DC

## 2021-11-17 NOTE — Assessment & Plan Note (Signed)
Has intermittent episodic episodes of migraine, but once every 2-3 months ?Discontinue Maxalt as it is ineffective ?Start sumatriptan as needed for now ?

## 2021-11-17 NOTE — Progress Notes (Signed)
? ?Established Patient Office Visit ? ?Subjective:  ?Patient ID: Kim [name], female    DOB: 06/29/1996  Age: 26 y.o. MRN: [id number] ? ?CC:  ?Chief Complaint  ?Patient presents with  ? Follow-up  ?  Follow up was seeing michael gray pt has swelling in feet and ankles has lower back pain and amonia smelling urine this has been going on since the beginning of April also has spells where she is light headed dizzy and nauseated she also would like to discuss migraines   ? ? ?HPI ?Kim [name] is a 26 y.o. female with past medical history of migraine, GAD and chronic diarrhea who presents for f/u of her chronic medical conditions. ? ?Migraine: She has been having intermittent episodes of headache.  She has nausea and dizziness at times as well.  She has tried Recruitment consultant.  She still states that she had allergic reaction to Nurtec.  Maxalt does not help her.  She states that she had good response with Imitrex.  She has episodes of migraine once every 2 to 3 months. ? ?GAD: She was on Zoloft initially, but was having diarrhea and other GI symptoms with it.  She was switched to Lexapro, but did not help with her anxiety.  She has also stopped taking BuSpar.  She has history of panic episode, according to chart review as well.  She denies any SI or HI currently. ? ?She reports having ammonia smell in her urine, but denies any dysuria or hematuria.  She has chronic [name] back pain, but denies any flank or pelvic pain currently.  She has chronic lower abdominal spasms, for which she takes Levsin. ? ?She also complains of bilateral leg swelling, which is chronic.  It is worse after standing for prolonged time.  She denies any dyspnea, orthopnea or PND. ? ? ?Past Medical History:  ?Diagnosis Date  ? ADHD (attention deficit hyperactivity disorder)   ? Anxiety   ? Depression   ? Encounter for gynecological examination with Papanicolaou smear of cervix 03/27/2017  ? Encounter for surveillance of contraceptive  pills 03/27/2017  ? GERD (gastroesophageal reflux disease)   ? Migraines   ? ? ?Past Surgical History:  ?Procedure Laterality Date  ? BIOPSY  01/06/2021  ? Procedure: BIOPSY;  Surgeon: Malissa Hippo, MD;  Location: [facility];  Service: Endoscopy;;  ? COLONOSCOPY WITH PROPOFOL N/A 01/06/2021  ? Procedure: COLONOSCOPY WITH PROPOFOL;  Surgeon: Malissa Hippo, MD;  [facility]: [facility];  Service: Endoscopy;  Laterality: N/A;  1:20  ? ESOPHAGOGASTRODUODENOSCOPY (EGD) WITH PROPOFOL N/A 01/06/2021  ? Procedure: ESOPHAGOGASTRODUODENOSCOPY (EGD) WITH PROPOFOL;  Surgeon: Malissa Hippo, MD;  Location: [facility];  Service: Endoscopy;  Laterality: N/A;  ? ? ?Family History  ?Problem Relation Age of Onset  ? Cancer Paternal Grandfather   ? Alzheimer's disease Paternal Grandmother   ? Cancer Maternal Grandmother   ?     breast   ? Cancer Maternal Grandfather   ?     pancreatic and bone  ? Cancer Father   ?     lung  ? Hypertension Mother   ? ? ?Social History  ? ?Socioeconomic History  ? Marital status: Single  ?  Spouse name: Not on file  ? Number of children: Not on file  ? Years of education: Not on file  ? Highest education level: Associate degree: occupational, Scientist, product/process development, or vocational program  ?Occupational History  ? Not on file  ?Tobacco Use  ?  Smoking status: Never  ? Smokeless tobacco: Never  ?Vaping Use  ? Vaping Use: Never used  ?Substance and Sexual Activity  ? Alcohol use: Yes  ?  Comment: monthly  ? Drug use: No  ? Sexual activity: Yes  ?  Birth control/protection: I.U.D.  ?Other Topics Concern  ? Not on file  ?Social History Narrative  ? Lives with mom, step dad  ?   ? Pets: 3 dogs: Skimp, Ginger, and Evie May  ?   ? Enjoys: read-genre: poetry, movies and music  ?   ? Diet: eats all food groups, snacks with stress  ? Caffeine: coffee-2 cups; tea daily, mountain dew   ? Water: 1-2 cups daily  ?    ? Does not wear seat belt due to being anxiety   ? Does not use phone while driving  ? Smoke detectors  at home   ? ?Social Determinants of Health  ? ?Financial Resource Strain: Not on file  ?Food Insecurity: Not on file  ?Transportation Needs: Not on file  ?Physical Activity: Not on file  ?Stress: Not on file  ?Social Connections: Not on file  ?Intimate Partner Violence: Not on file  ? ? ?Outpatient Medications Prior to Visit  ?Medication Sig Dispense Refill  ? acetaminophen (TYLENOL) 500 MG tablet Take 500-1,000 mg by mouth every 6 (six) hours as needed for moderate pain or headache.    ? hyoscyamine (LEVBID) 0.375 MG 12 hr tablet Take 1 tablet (0.375 mg total) by mouth daily before breakfast. 30 tablet 5  ? Ibuprofen 200 MG CAPS Take 200-400 mg by mouth 2 (two) times daily as needed (pain). For abdominal pain    ? escitalopram (LEXAPRO) 5 MG tablet Take 1 tablet (5 mg total) by mouth daily. 90 tablet 1  ? Ascorbic Acid (VITAMIN C PO) Take 1 tablet by mouth daily. (Patient not taking: Reported on 11/17/2021)    ? benzonatate (TESSALON) 100 MG capsule Take 1 capsule (100 mg total) by mouth every 8 (eight) hours. (Patient not taking: Reported on 11/17/2021) 21 capsule 0  ? busPIRone (BUSPAR) 10 MG tablet Take 0.5 tablets (5 mg total) by mouth 3 (three) times daily. (Patient not taking: Reported on 11/17/2021) 90 tablet 1  ? chlorpheniramine-HYDROcodone (TUSSIONEX PENNKINETIC ER) 10-8 MG/5ML SUER Take 5 mLs by mouth every 12 (twelve) hours as needed for cough. (Patient not taking: Reported on 11/17/2021) 115 mL 0  ? Cyanocobalamin (B-12 PO) Take 1 tablet by mouth daily. (Patient not taking: Reported on 11/17/2021)    ? fluticasone (FLONASE) 50 MCG/ACT nasal spray Use 2 sprays in each nostril twice daily for a week. After 1 week, decrease to 1 spray in each nostril twice daily  as needed for congestion/allergies. (Patient not taking: Reported on 11/17/2021) 16 g 6  ? omeprazole (PRILOSEC) 40 MG capsule TAKE 1 CAPSULE BY MOUTH ONCE DAILY ON AN EMPTY STOMACH 1 HOUR PRIOR TO EATING OR DRINKING. 90 capsule 0  ? predniSONE  (DELTASONE) 20 MG tablet Take 2 tablets (40 mg total) by mouth daily with breakfast. (Patient not taking: Reported on 11/17/2021) 10 tablet 0  ? Rimegepant Sulfate (NURTEC) 75 MG TBDP Take 1 tablet by mouth every other day. Do not take more than 1 dose in a 24 hour period 16 tablet 5  ? rizatriptan (MAXALT-MLT) 5 MG disintegrating tablet Dissolve 1 tablet (5 mg total) in mouth as needed for migraine. May repeat in 2 hours if needed. (Patient not taking: Reported on 11/17/2021) 10 tablet  0  ? VITAMIN D PO Take 1 capsule by mouth daily. (Patient not taking: Reported on 11/17/2021)    ? Vitamin D, Ergocalciferol, (DRISDOL) 1.25 MG (50000 UNIT) CAPS capsule Take 1 capsule (50,000 Units total) by mouth every 7 (seven) days. (Patient not taking: Reported on 11/17/2021) 12 capsule 0  ? ?No facility-administered medications prior to visit.  ? ? ?Allergies  ?Allergen Reactions  ? Amoxicillin Rash  ? Penicillins Rash  ? Doxycycline Hives  ? Dicyclomine Rash  ? Latex Itching  ? ? ?ROS ?Review of Systems  ?Constitutional:  Positive for fatigue. Negative for chills and fever.  ?HENT:  Negative for congestion, sinus pressure, sinus pain and sore throat.   ?Eyes:  Negative for pain and discharge.  ?Respiratory:  Negative for cough and shortness of breath.   ?Cardiovascular:  Positive for leg swelling. Negative for chest pain and palpitations.  ?Gastrointestinal:  Positive for abdominal pain, diarrhea and nausea. Negative for vomiting.  ?Endocrine: Negative for polydipsia and polyuria.  ?Genitourinary:  Negative for dysuria and hematuria.  ?     Foul-smelling urine  ?Musculoskeletal:  Positive for back pain. Negative for neck pain and neck stiffness.  ?Skin:  Negative for rash.  ?Neurological:  Positive for dizziness and headaches. Negative for weakness.  ?Psychiatric/Behavioral:  Positive for sleep disturbance. Negative for agitation and behavioral problems. The patient is nervous/anxious.   ? ?  ?Objective:  ?  ?Physical Exam ?Vitals  reviewed.  ?Constitutional:   ?   General: She is not in acute distress. ?   Appearance: She is not diaphoretic.  ?HENT:  ?   Head: Normocephalic and atraumatic.  ?   Nose: Nose normal. No congestion.  ?   Mout

## 2021-11-17 NOTE — Assessment & Plan Note (Signed)
Denies any dysuria or hematuria ?Has dark and foul-smelling urine ?Check UA ?

## 2021-11-17 NOTE — Patient Instructions (Signed)
Please start taking Wellbutrin instead of Lexapro. ? ?Please take Sumatriptan for migraine. ? ?Please increase fluid intake to at least 64 ounces in a day. Avoid skipping any meals. ?

## 2021-11-17 NOTE — Assessment & Plan Note (Signed)
Uncontrolled with Lexapro ?Has tried Zoloft, had GI side effects with it ?Switched to Wellbutrin ?

## 2021-11-17 NOTE — Assessment & Plan Note (Signed)
Chronic, worse with prolonged standing ?Advised to wear compression socks as needed ?Leg elevation as tolerated ?

## 2021-11-18 ENCOUNTER — Other Ambulatory Visit: Payer: Self-pay | Admitting: Internal Medicine

## 2021-11-18 DIAGNOSIS — N3000 Acute cystitis without hematuria: Secondary | ICD-10-CM

## 2021-11-18 MED ORDER — SULFAMETHOXAZOLE-TRIMETHOPRIM 800-160 MG PO TABS: 1.0000 | ORAL_TABLET | Freq: Two times a day (BID) | ORAL | 0 refills | Status: DC

## 2021-11-23 LAB — UA/M W/RFLX CULTURE, ROUTINE
Bilirubin, UA: NEGATIVE
Glucose, UA: NEGATIVE
Nitrite, UA: POSITIVE — AB
RBC, UA: NEGATIVE
Specific Gravity, UA: 1.025 (ref 1.005–1.030)
Urobilinogen, Ur: 0.2 mg/dL (ref 0.2–1.0)
pH, UA: 5 (ref 5.0–7.5)

## 2021-11-23 LAB — URINE CULTURE, REFLEX

## 2021-11-23 LAB — MICROSCOPIC EXAMINATION
Casts: NONE SEEN /lpf
Epithelial Cells (non renal): >10 /hpf — AB (ref 0–10)

## 2022-02-18 ENCOUNTER — Encounter: Payer: Self-pay | Admitting: Internal Medicine

## 2022-03-01 ENCOUNTER — Encounter: Payer: Self-pay | Admitting: Internal Medicine

## 2022-03-08 ENCOUNTER — Ambulatory Visit (INDEPENDENT_AMBULATORY_CARE_PROVIDER_SITE_OTHER): Payer: Self-pay | Admitting: Internal Medicine

## 2022-03-08 ENCOUNTER — Encounter: Payer: Self-pay | Admitting: Internal Medicine

## 2022-03-08 VITALS — BP 122/82 | HR 107 | Resp 16 | Ht 71.0 in | Wt 182.8 lb

## 2022-03-08 DIAGNOSIS — F410 Panic disorder [episodic paroxysmal anxiety] without agoraphobia: Secondary | ICD-10-CM

## 2022-03-08 DIAGNOSIS — F411 Generalized anxiety disorder: Secondary | ICD-10-CM

## 2022-03-08 DIAGNOSIS — E559 Vitamin D deficiency, unspecified: Secondary | ICD-10-CM

## 2022-03-08 DIAGNOSIS — K582 Mixed irritable bowel syndrome: Secondary | ICD-10-CM | POA: Insufficient documentation

## 2022-03-08 DIAGNOSIS — G250 Essential tremor: Secondary | ICD-10-CM | POA: Insufficient documentation

## 2022-03-08 LAB — CMP14+EGFR
ALT: 9 IU/L (ref 0–32)
AST: 12 IU/L (ref 0–40)
Albumin/Globulin Ratio: 1.7 (ref 1.2–2.2)
Albumin: 4.8 g/dL (ref 4.0–5.0)
Alkaline Phosphatase: 65 IU/L (ref 44–121)
BUN/Creatinine Ratio: 11 (ref 9–23)
BUN: 8 mg/dL (ref 6–20)
Bilirubin Total: 0.6 mg/dL (ref 0.0–1.2)
CO2: 22 mmol/L (ref 20–29)
Calcium: 9.5 mg/dL (ref 8.7–10.2)
Chloride: 102 mmol/L (ref 96–106)
Creatinine, Ser: 0.7 mg/dL (ref 0.57–1.00)
Globulin, Total: 2.8 g/dL (ref 1.5–4.5)
Glucose: 87 mg/dL (ref 70–99)
Potassium: 4.4 mmol/L (ref 3.5–5.2)
Sodium: 139 mmol/L (ref 134–144)
Total Protein: 7.6 g/dL (ref 6.0–8.5)
eGFR: 122 mL/min/{1.73_m2} (ref >59)

## 2022-03-08 LAB — LIPID PANEL
Chol/HDL Ratio: 3.2 ratio (ref 0.0–4.4)
Cholesterol, Total: 143 mg/dL (ref 100–199)
HDL: 45 mg/dL (ref >39)
LDL Chol Calc (NIH): 85 mg/dL (ref 0–99)
Triglycerides: 61 mg/dL (ref 0–149)
VLDL Cholesterol Cal: 13 mg/dL (ref 5–40)

## 2022-03-08 LAB — CBC WITH DIFFERENTIAL/PLATELET
Basophils Absolute: 0.1 10*3/uL (ref 0.0–0.2)
Basos: 1 %
EOS (ABSOLUTE): 0.1 10*3/uL (ref 0.0–0.4)
Eos: 1 %
Hematocrit: 42.7 % (ref 34.0–46.6)
Hemoglobin: 14.2 g/dL (ref 11.1–15.9)
Immature Grans (Abs): 0 10*3/uL (ref 0.0–0.1)
Immature Granulocytes: 0 %
Lymphocytes Absolute: 2 10*3/uL (ref 0.7–3.1)
Lymphs: 30 %
MCH: 30.8 pg (ref 26.6–33.0)
MCHC: 33.3 g/dL (ref 31.5–35.7)
MCV: 93 fL (ref 79–97)
Monocytes Absolute: 0.4 10*3/uL (ref 0.1–0.9)
Monocytes: 6 %
Neutrophils Absolute: 4.2 10*3/uL (ref 1.4–7.0)
Neutrophils: 62 %
Platelets: 231 10*3/uL (ref 150–450)
RBC: 4.61 x10E6/uL (ref 3.77–5.28)
RDW: 11.7 % (ref 11.7–15.4)
WBC: 6.7 10*3/uL (ref 3.4–10.8)

## 2022-03-08 LAB — HEMOGLOBIN A1C
Est. average glucose Bld gHb Est-mCnc: 94 mg/dL
Hgb A1c MFr Bld: 4.9 % (ref 4.8–5.6)

## 2022-03-08 LAB — TSH: TSH: 1.73 u[IU]/mL (ref 0.450–4.500)

## 2022-03-08 LAB — VITAMIN D 25 HYDROXY (VIT D DEFICIENCY, FRACTURES): Vit D, 25-Hydroxy: 21.9 ng/mL — ABNORMAL LOW (ref 30.0–100.0)

## 2022-03-08 MED ORDER — HYOSCYAMINE SULFATE ER 0.375 MG PO TB12: 0.3750 mg | ORAL_TABLET | Freq: Every day | ORAL | 5 refills | Status: DC

## 2022-03-08 MED ORDER — BUPROPION HCL ER (XL) 150 MG PO TB24: 150.0000 mg | ORAL_TABLET | Freq: Every day | ORAL | 5 refills | Status: DC

## 2022-03-08 MED ORDER — VITAMIN D (ERGOCALCIFEROL) 1.25 MG (50000 UNIT) PO CAPS: 50000.0000 [IU] | ORAL_CAPSULE | ORAL | 1 refills | Status: DC

## 2022-03-08 MED ORDER — HYDROXYZINE PAMOATE 25 MG PO CAPS: 25.0000 mg | ORAL_CAPSULE | Freq: Three times a day (TID) | ORAL | 3 refills | Status: DC | PRN

## 2022-03-08 NOTE — Assessment & Plan Note (Signed)
Likely due to anxiety Needs to cut down caffeinated product intake, including soft drinks

## 2022-03-08 NOTE — Assessment & Plan Note (Signed)
Well controlled with Wellbutrin overall Has episodes of anxiety at times based on situation, added Vistaril PRN Was uncontrolled with Lexapro Has tried Zoloft, had GI side effects with it

## 2022-03-08 NOTE — Progress Notes (Signed)
Established Patient Office Visit  Subjective:  Patient ID: Kim [name], female    DOB: [birth year]  Age: 26 y.o. MRN: [id number]  CC:  Chief Complaint  Patient presents with   Anxiety    HPI Kim [name] is a 26 y.o. female with past medical history of migraine, GAD and chronic diarrhea who presents for f/u of her chronic medical conditions.  GAD: She is feeling better with Wellbutrin, except few episodes of anxiety at times.  She has tried Zoloft, Lexapro and BuSpar, but did not help much.  She has history of panic episode in the past.  Denies any recent panic episode.  Denies any SI or HI currently.  IBS: She has history of chronic diarrhea from IBS.  She takes hyoscyamine as needed for diarrhea and abdominal cramping.  Of note, she has been having constipation recently.  Denies any melena or hematochezia.  She has tried Dulcolax in the past.     Past Medical History:  Diagnosis Date   ADHD (attention deficit hyperactivity disorder)    Anxiety    Depression    Encounter for gynecological examination with Papanicolaou smear of cervix 03/27/2017   Encounter for surveillance of contraceptive pills 03/27/2017   GERD (gastroesophageal reflux disease)    Migraines     Past Surgical History:  Procedure Laterality Date   BIOPSY  01/06/2021   Procedure: BIOPSY;  Surgeon: Rogene Houston, MD;  [facility];  Service: Endoscopy;;   COLONOSCOPY WITH PROPOFOL N/A 01/06/2021   Procedure: COLONOSCOPY WITH PROPOFOL;  Surgeon: Rogene Houston, MD;  [facility];  Service: Endoscopy;  Laterality: N/A;  1:20   ESOPHAGOGASTRODUODENOSCOPY (EGD) WITH PROPOFOL N/A 01/06/2021   Procedure: ESOPHAGOGASTRODUODENOSCOPY (EGD) WITH PROPOFOL;  Surgeon: Rogene Houston, MD;  [facility];  Service: Endoscopy;  Laterality: N/A;    Family History  Problem Relation Age of Onset   Cancer Paternal Grandfather    Alzheimer's disease Paternal Grandmother     Cancer Maternal Grandmother        breast    Cancer Maternal Grandfather        pancreatic and bone   Cancer Father        lung   Hypertension Mother     Social History   Socioeconomic History   Marital status: Single    Spouse name: Not on file   Number of children: Not on file   Years of education: Not on file   Highest education level: Associate degree: occupational, Hotel manager, or vocational program  Occupational History   Not on file  Tobacco Use   Smoking status: Never   Smokeless tobacco: Never  Vaping Use   Vaping Use: Never used  Substance and Sexual Activity   Alcohol use: Yes    Comment: monthly   Drug use: No   Sexual activity: Yes    Birth control/protection: I.U.D.  Other Topics Concern   Not on file  Social History Narrative   Lives with mom, step dad      Pets: 3 dogs: Skimp, Ginger, and Evie May      Enjoys: read-genre: poetry, movies and music      Diet: eats all food groups, snacks with stress   Caffeine: coffee-2 cups; tea daily, mountain dew    Water: 1-2 cups daily       Does not wear seat belt due to being anxiety    Does not use phone while driving   Smoke  detectors at home    Social Determinants of Health   Financial Resource Strain: Not on file  Food Insecurity: Not on file  Transportation Needs: Not on file  Physical Activity: Not on file  Stress: Not on file  Social Connections: Not on file  Intimate Partner Violence: Not on file    Outpatient Medications Prior to Visit  Medication Sig Dispense Refill   acetaminophen (TYLENOL) 500 MG tablet Take 500-1,000 mg by mouth every 6 (six) hours as needed for moderate pain or headache.     Ibuprofen 200 MG CAPS Take 200-400 mg by mouth 2 (two) times daily as needed (pain). For abdominal pain     SUMAtriptan (IMITREX) 100 MG tablet Take 1 tablet (100 mg total) by mouth every 2 (two) hours as needed for migraine. May repeat in 2 hours if headache persists or recurs. 10 tablet 2    buPROPion (WELLBUTRIN XL) 150 MG 24 hr tablet Take 1 tablet (150 mg total) by mouth daily. 30 tablet 2   hyoscyamine (LEVBID) 0.375 MG 12 hr tablet Take 1 tablet (0.375 mg total) by mouth daily before breakfast. 30 tablet 5   sulfamethoxazole-trimethoprim (BACTRIM DS) 800-160 MG tablet Take 1 tablet by mouth 2 (two) times daily. 10 tablet 0   No facility-administered medications prior to visit.    Allergies  Allergen Reactions   Amoxicillin Rash   Penicillins Rash   Doxycycline Hives   Dicyclomine Rash   Latex Itching    ROS Review of Systems  Constitutional:  Positive for fatigue. Negative for chills and fever.  HENT:  Negative for congestion, sinus pressure, sinus pain and sore throat.   Eyes:  Negative for pain and discharge.  Respiratory:  Negative for cough and shortness of breath.   Cardiovascular:  Negative for chest pain and palpitations.  Gastrointestinal:  Positive for diarrhea and nausea. Negative for vomiting.  Endocrine: Negative for polydipsia and polyuria.  Genitourinary:  Negative for dysuria and hematuria.  Musculoskeletal:  Positive for back pain. Negative for neck pain and neck stiffness.  Skin:  Negative for rash.  Neurological:  Negative for dizziness and weakness.  Psychiatric/Behavioral:  Negative for agitation and behavioral problems. The patient is nervous/anxious.       Objective:    Physical Exam Vitals reviewed.  Constitutional:      General: She is not in acute distress.    Appearance: She is not diaphoretic.  HENT:     Head: Normocephalic and atraumatic.     Nose: Nose normal. No congestion.     Mouth/Throat:     Mouth: Mucous membranes are moist.     Pharynx: No posterior oropharyngeal erythema.  Eyes:     General: No scleral icterus.    Extraocular Movements: Extraocular movements intact.  Cardiovascular:     Rate and Rhythm: Normal rate and regular rhythm.     Pulses: Normal pulses.     Heart sounds: Normal heart sounds. No murmur  heard. Pulmonary:     Breath sounds: Normal breath sounds. No wheezing or rales.  Musculoskeletal:     Cervical back: Neck supple. No tenderness.     Right lower leg: No edema.     Left lower leg: No edema.  Skin:    General: Skin is warm.     Findings: No rash.  Neurological:     General: No focal deficit present.     Mental Status: She is alert and oriented to person, place, and time.  Sensory: No sensory deficit.     Motor: Tremor present. No weakness.  Psychiatric:        Mood and Affect: Mood normal.        Behavior: Behavior normal.     BP 122/82 (BP Location: Left Arm, Patient Position: Sitting, Cuff Size: Normal)   Pulse (!) 107   Resp 16   Ht _0  (1.803 m)   Wt 182 lb 12.8 oz (82.9 kg)   SpO2 100%   BMI 25.50 kg/m  Wt Readings from Last 3 Encounters:  03/08/22 182 lb 12.8 oz (82.9 kg)  11/17/21 174 lb (78.9 kg)  03/26/21 175 lb (79.4 kg)    Lab Results  Component Value Date   TSH 1.730 03/07/2022   Lab Results  Component Value Date   WBC 6.7 03/07/2022   HGB 14.2 03/07/2022   HCT 42.7 03/07/2022   MCV 93 03/07/2022   PLT 231 03/07/2022   Lab Results  Component Value Date   NA 139 03/07/2022   K 4.4 03/07/2022   CO2 22 03/07/2022   GLUCOSE 87 03/07/2022   BUN 8 03/07/2022   CREATININE 0.70 03/07/2022   BILITOT 0.6 03/07/2022   ALKPHOS 65 03/07/2022   AST 12 03/07/2022   ALT 9 03/07/2022   PROT 7.6 03/07/2022   ALBUMIN 4.8 03/07/2022   CALCIUM 9.5 03/07/2022   ANIONGAP 11 03/26/2021   EGFR 122 03/07/2022   Lab Results  Component Value Date   CHOL 143 03/07/2022   Lab Results  Component Value Date   HDL 45 03/07/2022   Lab Results  Component Value Date   LDLCALC 85 03/07/2022   Lab Results  Component Value Date   TRIG 61 03/07/2022   Lab Results  Component Value Date   CHOLHDL 3.2 03/07/2022   Lab Results  Component Value Date   HGBA1C 4.9 03/07/2022      Assessment & Plan:   Problem List Items Addressed This  Visit       Digestive   Irritable bowel syndrome with both constipation and diarrhea    Takes Hyoscyamine PRN for abdominal cramping She reports episodes of constipation, advised to take MiraLAX as needed and to avoid hyoscyamine when she has constipation      Relevant Medications   hyoscyamine (LEVBID) 0.375 MG 12 hr tablet     Nervous and Auditory   Essential tremor    Likely due to anxiety Needs to cut down caffeinated product intake, including soft drinks        Other   Panic attacks    On Wellbutrin for GAD Added Vistaril as needed for spells of anxiety/panic episode      Relevant Medications   hydrOXYzine (VISTARIL) 25 MG capsule   buPROPion (WELLBUTRIN XL) 150 MG 24 hr tablet   GAD (generalized anxiety disorder) - Primary    Well controlled with Wellbutrin overall Has episodes of anxiety at times based on situation, added Vistaril PRN Was uncontrolled with Lexapro Has tried Zoloft, had GI side effects with it       Relevant Medications   hydrOXYzine (VISTARIL) 25 MG capsule   buPROPion (WELLBUTRIN XL) 150 MG 24 hr tablet   Vitamin D deficiency    Started Vitamin D 50,000 IU qw      Relevant Medications   Vitamin D, Ergocalciferol, (DRISDOL) 1.25 MG (50000 UNIT) CAPS capsule    Meds ordered this encounter  Medications   Vitamin D, Ergocalciferol, (DRISDOL) 1.25 MG (50000 UNIT) CAPS capsule  Sig: Take 1 capsule (50,000 Units total) by mouth every 7 (seven) days.    Dispense:  12 capsule    Refill:  1   hydrOXYzine (VISTARIL) 25 MG capsule    Sig: Take 1 capsule (25 mg total) by mouth every 8 (eight) hours as needed for anxiety.    Dispense:  30 capsule    Refill:  3   buPROPion (WELLBUTRIN XL) 150 MG 24 hr tablet    Sig: Take 1 tablet (150 mg total) by mouth daily.    Dispense:  30 tablet    Refill:  5   hyoscyamine (LEVBID) 0.375 MG 12 hr tablet    Sig: Take 1 tablet (0.375 mg total) by mouth daily before breakfast.    Dispense:  30 tablet     Refill:  5    Follow-up: Return in about 6 months (around 09/08/2022) for GAD.    Lindell Spar, MD

## 2022-03-08 NOTE — Assessment & Plan Note (Signed)
On Wellbutrin for GAD Added Vistaril as needed for spells of anxiety/panic episode

## 2022-03-08 NOTE — Assessment & Plan Note (Signed)
Takes Hyoscyamine PRN for abdominal cramping She reports episodes of constipation, advised to take MiraLAX as needed and to avoid hyoscyamine when she has constipation 

## 2022-03-08 NOTE — Patient Instructions (Signed)
Please take Vistaril as needed for spells of anxiety.  Please continue taking other medications as prescribed.  Please try to cut down caffeinated product intake, including soft drinks.

## 2022-03-08 NOTE — Assessment & Plan Note (Signed)
Started Vitamin D 50,000 IU qw

## 2022-07-28 ENCOUNTER — Encounter: Payer: Self-pay | Admitting: Internal Medicine

## 2022-07-28 ENCOUNTER — Telehealth (INDEPENDENT_AMBULATORY_CARE_PROVIDER_SITE_OTHER): Payer: Self-pay | Admitting: Internal Medicine

## 2022-07-28 VITALS — Temp 102.9°F

## 2022-07-28 DIAGNOSIS — J069 Acute upper respiratory infection, unspecified: Secondary | ICD-10-CM

## 2022-07-28 DIAGNOSIS — R6889 Other general symptoms and signs: Secondary | ICD-10-CM

## 2022-07-28 DIAGNOSIS — Z20822 Contact with and (suspected) exposure to covid-19: Secondary | ICD-10-CM

## 2022-07-28 DIAGNOSIS — Z887 Allergy status to serum and vaccine status: Secondary | ICD-10-CM

## 2022-07-28 LAB — POCT INFLUENZA A/B
Influenza A, POC: NEGATIVE
Influenza B, POC: NEGATIVE

## 2022-07-28 NOTE — Patient Instructions (Signed)
Please come to office for flu and COVID test.

## 2022-07-28 NOTE — Addendum Note (Signed)
Addended by: Everett Graff D on: 07/28/2022 01:24 PM   Modules accepted: Orders

## 2022-07-28 NOTE — Progress Notes (Signed)
Virtual Visit via Video Note   Because of Kim [name]'s co-morbid illnesses, she is at least at moderate risk for complications without adequate follow up.  This format is felt to be most appropriate for this patient at this time.  All issues noted in this document were discussed and addressed.  A limited physical exam was performed with this format.      Evaluation Performed:  Follow-up visit  Date:  07/28/2022   ID:  Kim [name], DOB 1996/06/26, MRN [id number]  Patient Location: Home Provider Location: Office/Clinic  Participants: Patient Location of Patient: Home Location of Provider: Telehealth Consent was obtain for visit to be over via telehealth. I verified that I am speaking with the correct person using two identifiers.  PCP:  Kim Spar, MD   Chief Complaint: Cough and fever  History of Present Illness:    Kim [name] is a 27 y.o. female who has a video visit for complaint of fever, cough, sore throat, decreased appetite, body aches and chest congestion since yesterday.  She denies any dyspnea or wheezing currently.  She is allergic to flu vaccine.  She has tried taking Tylenol and ibuprofen as needed for body aches and fever.  The patient does have symptoms concerning for COVID-19 infection (fever, chills, cough, or new shortness of breath).   Past Medical, Surgical, Social History, Allergies, and Medications have been Reviewed.  Past Medical History:  Diagnosis Date   ADHD (attention deficit hyperactivity disorder)    Anxiety    Depression    Encounter for gynecological examination with Papanicolaou smear of cervix 03/27/2017   Encounter for surveillance of contraceptive pills 03/27/2017   GERD (gastroesophageal reflux disease)    Migraines    Past Surgical History:  Procedure Laterality Date   BIOPSY  01/06/2021   Procedure: BIOPSY;  Surgeon: Rogene Houston, MD;  [facility]: [facility];  Service: Endoscopy;;   COLONOSCOPY  WITH PROPOFOL N/A 01/06/2021   Procedure: COLONOSCOPY WITH PROPOFOL;  Surgeon: Rogene Houston, MD;  [facility];  Service: Endoscopy;  Laterality: N/A;  1:20   ESOPHAGOGASTRODUODENOSCOPY (EGD) WITH PROPOFOL N/A 01/06/2021   Procedure: ESOPHAGOGASTRODUODENOSCOPY (EGD) WITH PROPOFOL;  Surgeon: Rogene Houston, MD;  [facility]: [facility];  Service: Endoscopy;  Laterality: N/A;     No outpatient medications have been marked as taking for the 07/28/22 encounter (Video Visit) with Kim Spar, MD.     Allergies:   Amoxicillin, Penicillins, Doxycycline, Dicyclomine, and Latex   ROS:   Please see the history of present illness.     All other systems reviewed and are negative.   Labs/Other Tests and Data Reviewed:    Recent Labs: 03/07/2022: ALT 9; BUN 8; Creatinine, Ser 0.70; Hemoglobin 14.2; Platelets 231; Potassium 4.4; Sodium 139; TSH 1.730   Recent Lipid Panel Lab Results  Component Value Date/Time   CHOL 143 03/07/2022 02:54 PM   TRIG 61 03/07/2022 02:54 PM   HDL 45 03/07/2022 02:54 PM   CHOLHDL 3.2 03/07/2022 02:54 PM   LDLCALC 85 03/07/2022 02:54 PM    Wt Readings from Last 3 Encounters:  03/08/22 182 lb 12.8 oz (82.9 kg)  11/17/21 174 lb (78.9 kg)  03/26/21 175 lb (79.4 kg)     ASSESSMENT & PLAN:    URTI Suspected COVID-19 infection Check rapid flu and COVID RT-PCR Advised to maintain adequate hydration Tylenol as needed for fever and myalgias Mucinex or Robitussin as needed for cough Will  start antiviral if viral testing positive  I discussed the assessment and treatment plan with the patient. The patient was provided an opportunity to ask questions, and all were answered. The patient agreed with the plan and demonstrated an understanding of the instructions.   The patient was advised to call back or seek an in-person evaluation if the symptoms worsen or if the condition fails to improve as anticipated.  The above assessment and management plan  was discussed with the patient. The patient verbalized understanding of and has agreed to the management plan.   Medication Adjustments/Labs and Tests Ordered: Current medicines are reviewed at length with the patient today.  Concerns regarding medicines are outlined above.   Tests Ordered: No orders of the defined types were placed in this encounter.   Medication Changes: No orders of the defined types were placed in this encounter.    Note: This dictation was prepared with Dragon dictation along with smaller phrase technology. Similar sounding words can be transcribed inadequately or may not be corrected upon review. Any transcriptional errors that result from this process are unintentional.      Disposition:  Follow up  Signed, Kim Spar, MD  07/28/2022 11:52 AM     [place]

## 2022-07-29 ENCOUNTER — Other Ambulatory Visit: Payer: Self-pay | Admitting: Internal Medicine

## 2022-07-29 DIAGNOSIS — J069 Acute upper respiratory infection, unspecified: Secondary | ICD-10-CM

## 2022-07-29 MED ORDER — AZITHROMYCIN 250 MG PO TABS
ORAL_TABLET | ORAL | 0 refills | Status: AC
Start: 2022-07-29 — End: 2022-08-03

## 2022-07-30 LAB — NOVEL CORONAVIRUS, NAA: SARS-CoV-2, NAA: NOT DETECTED

## 2022-07-31 ENCOUNTER — Ambulatory Visit
Admission: EM | Admit: 2022-07-31 | Discharge: 2022-07-31 | Disposition: A | Discharge status: Home / Self Care | Payer: Self-pay | Attending: Urgent Care | Admitting: Urgent Care

## 2022-07-31 ENCOUNTER — Ambulatory Visit: Payer: Self-pay

## 2022-07-31 DIAGNOSIS — R070 Pain in throat: Secondary | ICD-10-CM

## 2022-07-31 DIAGNOSIS — R21 Rash and other nonspecific skin eruption: Secondary | ICD-10-CM

## 2022-07-31 DIAGNOSIS — J029 Acute pharyngitis, unspecified: Secondary | ICD-10-CM

## 2022-07-31 LAB — POCT RAPID STREP A (OFFICE): Rapid Strep A Screen: NEGATIVE

## 2022-07-31 MED ORDER — IBUPROFEN 100 MG/5ML PO SUSP: 600.0000 mg | Freq: Three times a day (TID) | ORAL | 1 refills | Status: DC | PRN

## 2022-07-31 MED ORDER — CLINDAMYCIN PALMITATE HCL 75 MG/5ML PO SOLR: 300.0000 mg | Freq: Three times a day (TID) | ORAL | 0 refills | Status: AC

## 2022-07-31 MED ORDER — TRIAMCINOLONE ACETONIDE 0.1 % MT PSTE: 1.0000 | PASTE | Freq: Two times a day (BID) | OROMUCOSAL | 0 refills | Status: DC

## 2022-07-31 NOTE — ED Triage Notes (Signed)
Pt reports fever 102.8 F, body aches x 4 days; sore throat x 3 days; sores in mouth and tongue feels thick  x 1 day. Pt taking Zpak, today is day #2. States she can not swallow the pills now as her throat feels swelling.    Flu and COVID negatives.

## 2022-07-31 NOTE — ED Notes (Signed)
Pt do not wants labs or images as she do not have insurance.

## 2022-07-31 NOTE — ED Provider Notes (Signed)
[facility]  Note:  This document was prepared using Systems analyst and may include unintentional dictation errors.  MRN: [id number] DOB: Oct 26, 1995  Subjective:   Kim [name] is a 27 y.o. female presenting for persistent fever, body aches, malaise, throat pain, painful swallowing.  Patient was sick in December as well and carried over into January.  She did not see a provider then due to cost burden and not having insurance.  She did end up seeing her primary care doctor 4 days ago.  Had negative COVID and flu test.  She was treated empirically with azithromycin.  However, she is feeling worse now including having sores on her lips, the underside of her tongue.  Has had significant difficulty swallowing and does not want any pill medication.  Prefers liquid.  Due to cost burden she does not want to pursue further testing.  No current facility-administered medications for this encounter.  Current Outpatient Medications:    azithromycin (ZITHROMAX) 250 MG tablet, Take 2 tablets on day 1, then 1 tablet daily on days 2 through 5, Disp: 6 tablet, Rfl: 0   acetaminophen (TYLENOL) 500 MG tablet, Take 500-1,000 mg by mouth every 6 (six) hours as needed for moderate pain or headache., Disp: , Rfl:    buPROPion (WELLBUTRIN XL) 150 MG 24 hr tablet, Take 1 tablet (150 mg total) by mouth daily., Disp: 30 tablet, Rfl: 5   hydrOXYzine (VISTARIL) 25 MG capsule, Take 1 capsule (25 mg total) by mouth every 8 (eight) hours as needed for anxiety., Disp: 30 capsule, Rfl: 3   hyoscyamine (LEVBID) 0.375 MG 12 hr tablet, Take 1 tablet (0.375 mg total) by mouth daily before breakfast., Disp: 30 tablet, Rfl: 5   Ibuprofen 200 MG CAPS, Take 200-400 mg by mouth 2 (two) times daily as needed (pain). For abdominal pain, Disp: , Rfl:    SUMAtriptan (IMITREX) 100 MG tablet, Take 1 tablet (100 mg total) by mouth every 2 (two) hours as needed for migraine. May repeat in 2 hours  if headache persists or recurs., Disp: 10 tablet, Rfl: 2   Vitamin D, Ergocalciferol, (DRISDOL) 1.25 MG (50000 UNIT) CAPS capsule, Take 1 capsule (50,000 Units total) by mouth every 7 (seven) days., Disp: 12 capsule, Rfl: 1   Allergies  Allergen Reactions   Amoxicillin Rash   Penicillins Rash   Doxycycline Hives   Dicyclomine Rash   Latex Itching    Past Medical History:  Diagnosis Date   ADHD (attention deficit hyperactivity disorder)    Anxiety    Depression    Encounter for gynecological examination with Papanicolaou smear of cervix 03/27/2017   Encounter for surveillance of contraceptive pills 03/27/2017   GERD (gastroesophageal reflux disease)    Migraines      Past Surgical History:  Procedure Laterality Date   BIOPSY  01/06/2021   Procedure: BIOPSY;  Surgeon: Rogene Houston, MD;  [facility]: [facility];  Service: Endoscopy;;   COLONOSCOPY WITH PROPOFOL N/A 01/06/2021   Procedure: COLONOSCOPY WITH PROPOFOL;  Surgeon: Rogene Houston, MD;  [facility];  Service: Endoscopy;  Laterality: N/A;  1:20   ESOPHAGOGASTRODUODENOSCOPY (EGD) WITH PROPOFOL N/A 01/06/2021   Procedure: ESOPHAGOGASTRODUODENOSCOPY (EGD) WITH PROPOFOL;  Surgeon: Rogene Houston, MD;  [facility];  Service: Endoscopy;  Laterality: N/A;    Family History  Problem Relation Age of Onset   Cancer Paternal Grandfather    Alzheimer's disease Paternal Grandmother    Cancer  Maternal Grandmother        breast    Cancer Maternal Grandfather        pancreatic and bone   Cancer Father        lung   Hypertension Mother     Social History   Tobacco Use   Smoking status: Never   Smokeless tobacco: Never  Vaping Use   Vaping Use: Never used  Substance Use Topics   Alcohol use: Yes    Comment: monthly   Drug use: No    ROS   Objective:   Vitals: BP 137/84 (BP Location: Right Arm)   Pulse (!) 145   Temp 100.1 F (37.8 C) (Oral)   Resp 18   LMP 07/31/2022 (Exact Date)    SpO2 97%   Wt Readings from Last 3 Encounters:  03/08/22 182 lb 12.8 oz (82.9 kg)  11/17/21 174 lb (78.9 kg)  03/26/21 175 lb (79.4 kg)   Temp Readings from Last 3 Encounters:  07/31/22 100.1 F (37.8 C) (Oral)  07/28/22 (!) 102.9 F (39.4 C) (Oral)  03/29/21 100.2 F (37.9 C)   BP Readings from Last 3 Encounters:  07/31/22 137/84  03/08/22 122/82  11/17/21 128/82   Pulse Readings from Last 3 Encounters:  07/31/22 (!) 145  03/08/22 (!) 107  11/17/21 92   Physical Exam Constitutional:      General: She is not in acute distress.    Appearance: Normal appearance. She is well-developed and normal weight. She is not ill-appearing, toxic-appearing or diaphoretic.  HENT:     Head: Normocephalic and atraumatic.     Right Ear: Tympanic membrane, ear canal and external ear normal. No drainage or tenderness. No middle ear effusion. There is no impacted cerumen. Tympanic membrane is not erythematous or bulging.     Left Ear: Tympanic membrane, ear canal and external ear normal. No drainage or tenderness.  No middle ear effusion. There is no impacted cerumen. Tympanic membrane is not erythematous or bulging.     Nose: Congestion and rhinorrhea present.     Mouth/Throat:     Mouth: Mucous membranes are moist. No oral lesions.     Pharynx: Pharyngeal swelling, oropharyngeal exudate and posterior oropharyngeal erythema present. No uvula swelling.     Tonsils: Tonsillar exudate present. No tonsillar abscesses. 1+ on the right. 1+ on the left.     Comments: Patient has aphthous ulcerations on the inner lower lip, underside of her tongue.  No geographic tongue. Eyes:     General: No scleral icterus.       Right eye: No discharge.        Left eye: No discharge.     Extraocular Movements: Extraocular movements intact.     Right eye: Normal extraocular motion.     Left eye: Normal extraocular motion.     Conjunctiva/sclera: Conjunctivae normal.  Cardiovascular:     Rate and Rhythm:  Regular rhythm. Tachycardia present.     Heart sounds: Normal heart sounds. No murmur heard.    No friction rub. No gallop.  Pulmonary:     Effort: Pulmonary effort is normal. No respiratory distress.     Breath sounds: No stridor. No wheezing, rhonchi or rales.  Chest:     Chest wall: No tenderness.  Musculoskeletal:     Cervical back: Normal range of motion and neck supple.  Lymphadenopathy:     Cervical: No cervical adenopathy.  Skin:    General: Skin is warm and dry.  Neurological:  General: No focal deficit present.     Mental Status: She is alert and oriented to person, place, and time.  Psychiatric:     Comments: Tearful and anxious in clinic.     Results for orders placed or performed during the hospital encounter of 07/31/22 (from the past 24 hour(s))  POCT rapid strep A     Status: None   Collection Time: 07/31/22  3:06 PM  Result Value Ref Range   Rapid Strep A Screen Negative Negative    Assessment and Plan :   PDMP not reviewed this encounter.  1. Acute pharyngitis, unspecified etiology   2. Throat pain   3. Rash     I recommended workup including a Monospot, strep culture, HIV, syphilis test, chest x-ray but patient declined due to cost burden as she does not have insurance.  I was agreeable to treat empirically for acute pharyngitis with clindamycin.  Use ibuprofen for pain relief.  She has follow-up coming up with her gynecologist.  Will reconsider getting more testing with them if necessary.  Counseled patient on potential for adverse effects with medications prescribed/recommended today, ER and return-to-clinic precautions discussed, patient verbalized understanding.    Wallis Bamberg, [place] 07/31/22 1545

## 2022-08-02 ENCOUNTER — Encounter: Payer: Self-pay | Admitting: Internal Medicine

## 2022-08-02 ENCOUNTER — Other Ambulatory Visit: Payer: Self-pay | Admitting: Internal Medicine

## 2022-08-02 DIAGNOSIS — J029 Acute pharyngitis, unspecified: Secondary | ICD-10-CM

## 2022-08-02 MED ORDER — LIDOCAINE VISCOUS HCL 2 % MT SOLN: 15.0000 mL | OROMUCOSAL | 0 refills | Status: DC | PRN

## 2022-08-21 IMAGING — DX DG CHEST 1V PORT
2 series · 2 of 2 positions shown · non-contrast
Comparison: 04/02/2012

CLINICAL DATA: Chest pain

EXAM:
PORTABLE CHEST 1 VIEW

[chest pa]
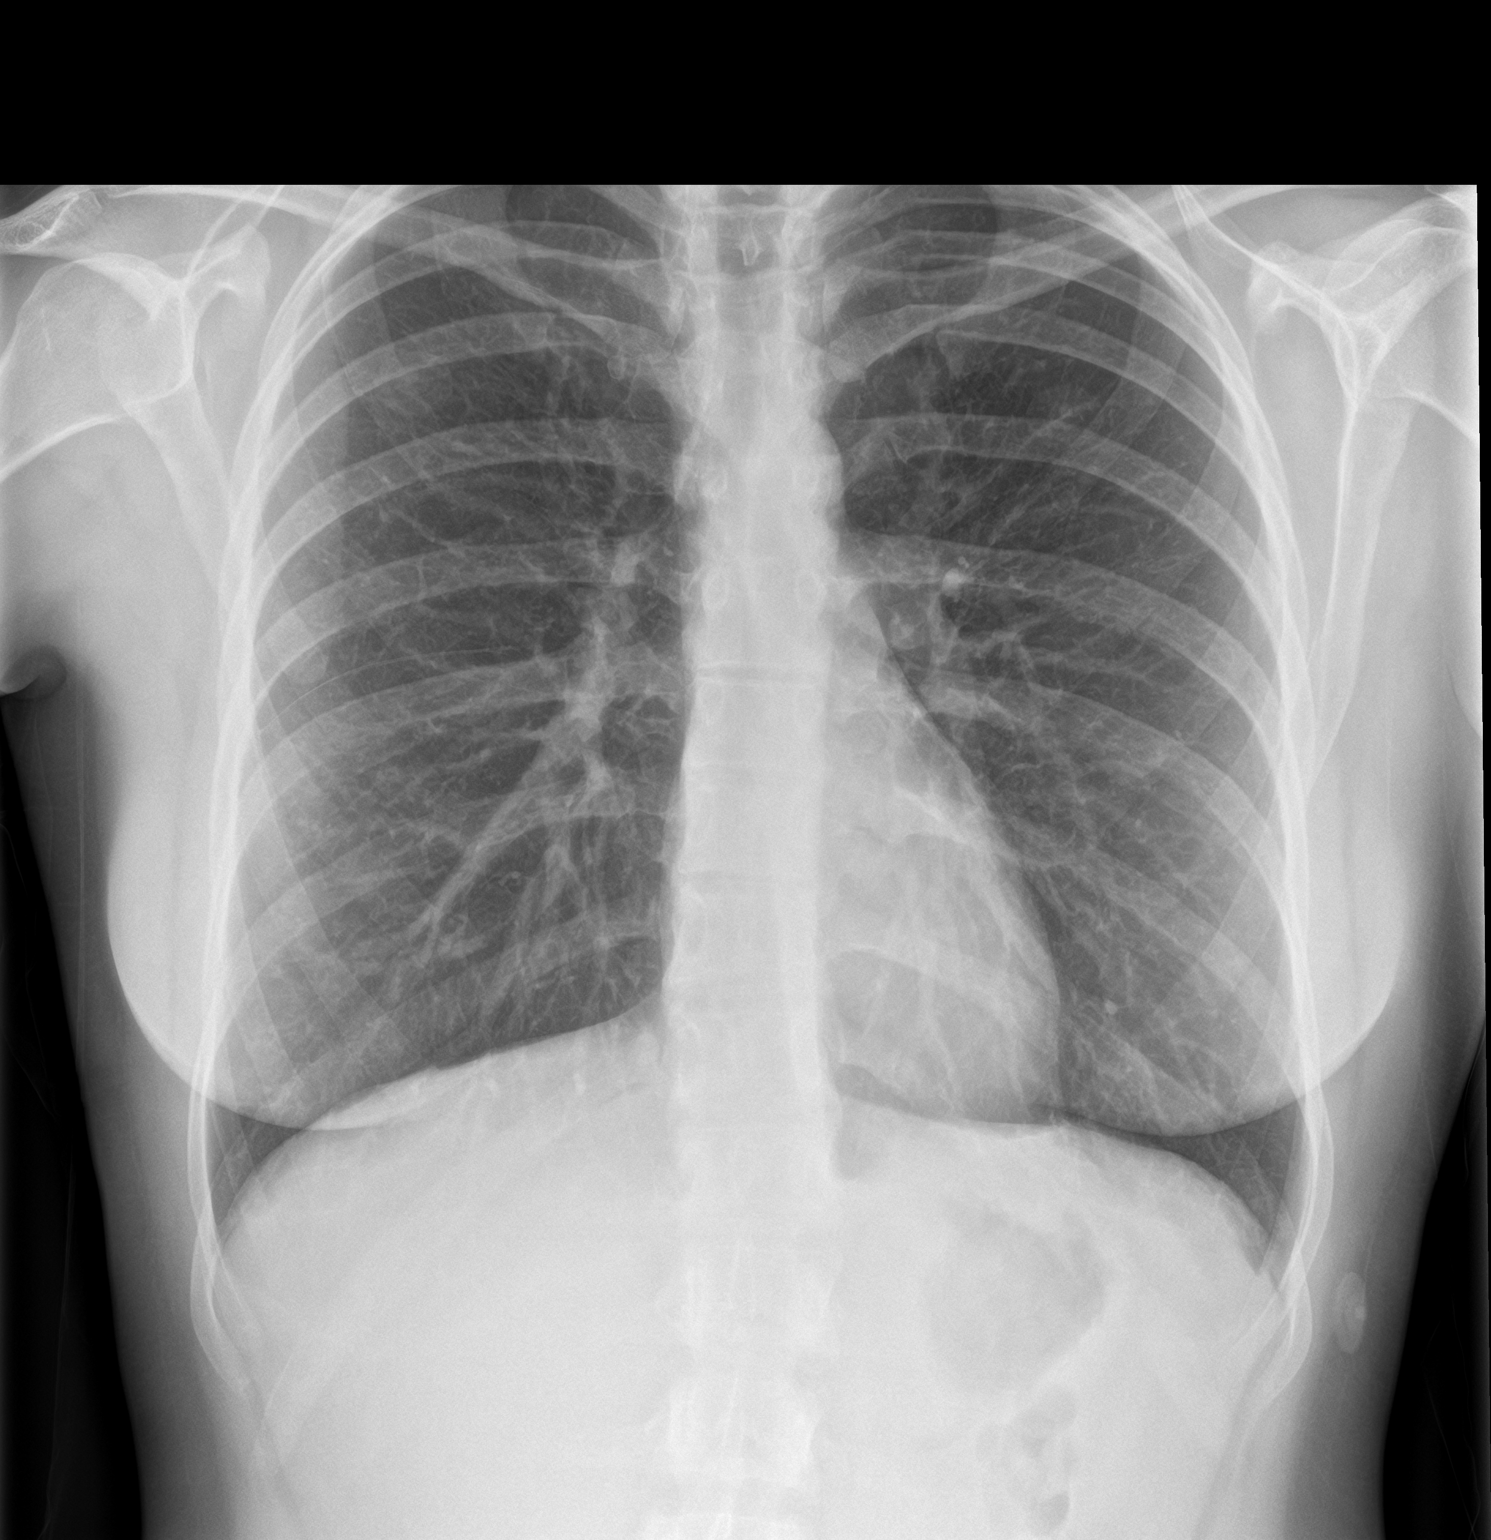

[chest lat]
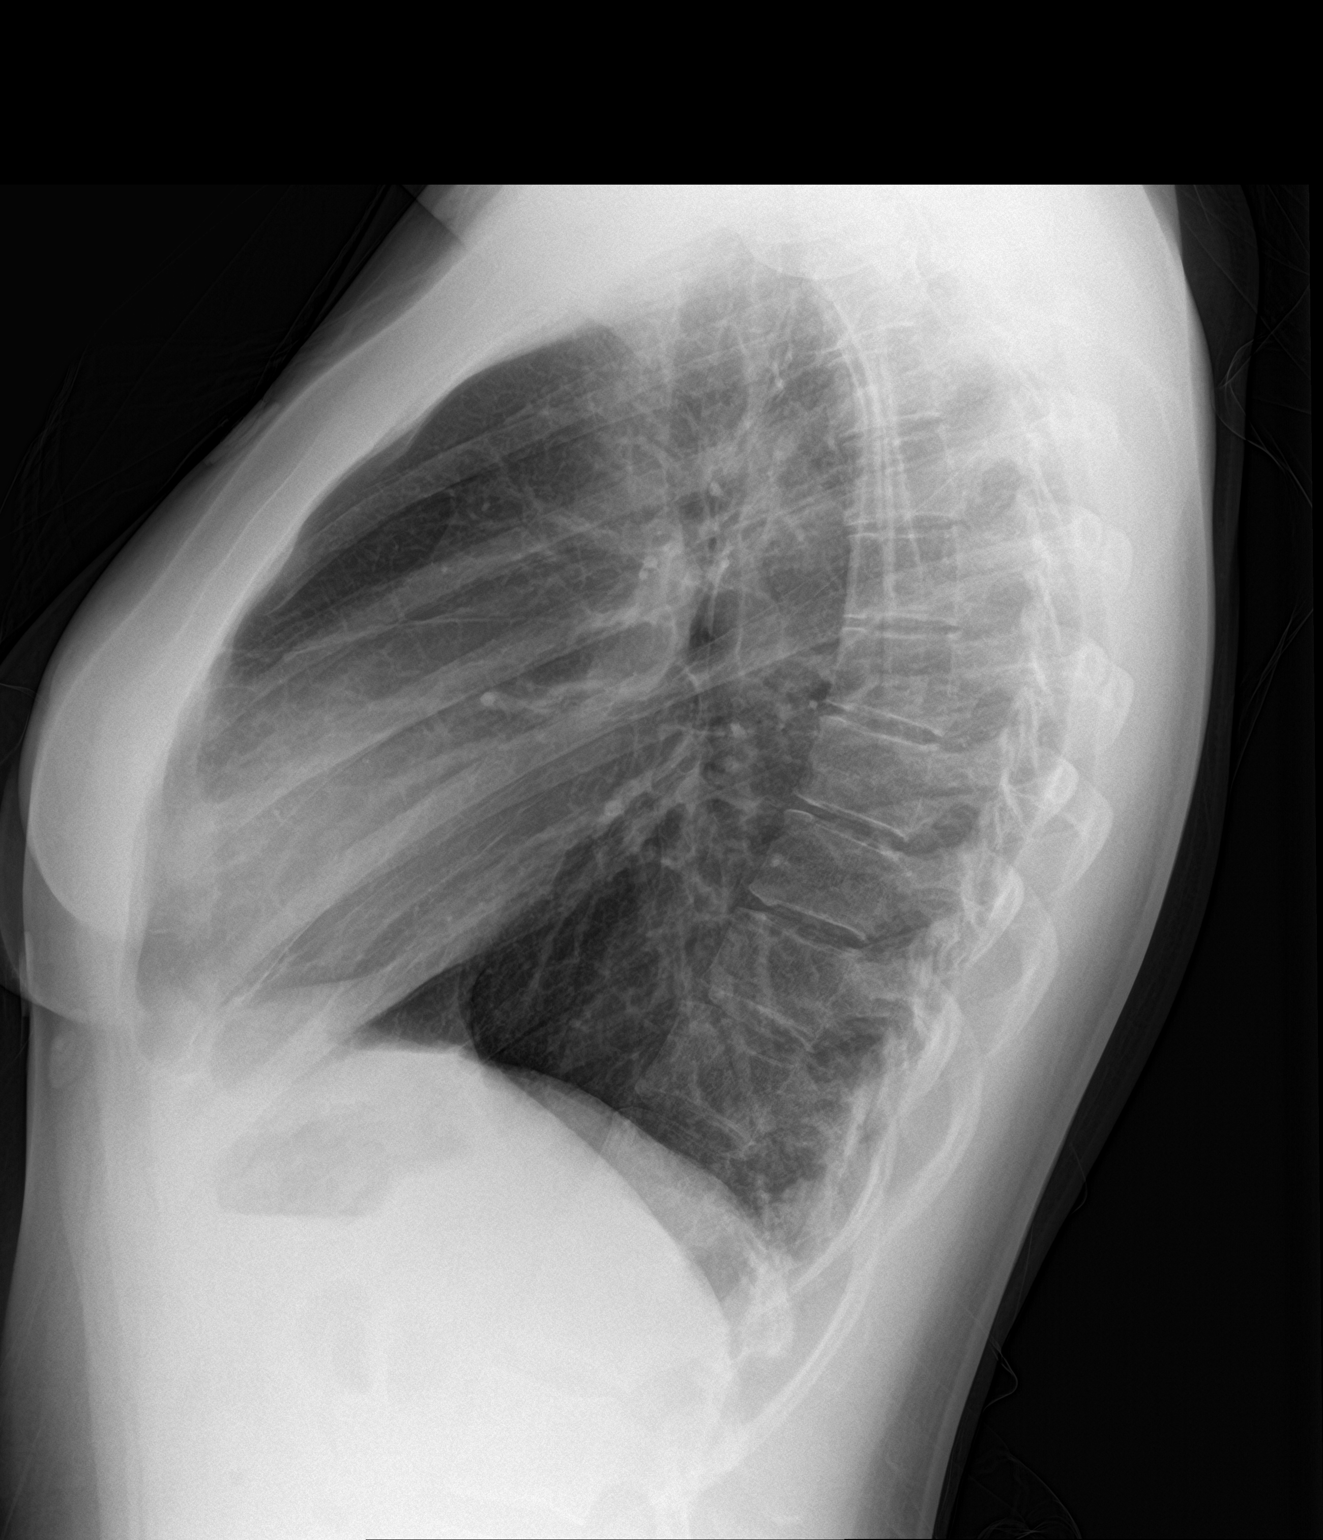

[2 of 2 positions shown; findings below may reference images not displayed]

FINDINGS: The heart size and mediastinal contours are within normal limits.
Both lungs are clear. The visualized skeletal structures are
unremarkable.
IMPRESSION: No acute abnormality of the lungs.

## 2022-09-08 ENCOUNTER — Ambulatory Visit (INDEPENDENT_AMBULATORY_CARE_PROVIDER_SITE_OTHER): Payer: Self-pay | Admitting: Internal Medicine

## 2022-09-08 ENCOUNTER — Encounter: Payer: Self-pay | Admitting: Internal Medicine

## 2022-09-08 VITALS — BP 126/76 | HR 91 | Ht 71.0 in | Wt 186.4 lb

## 2022-09-08 DIAGNOSIS — K582 Mixed irritable bowel syndrome: Secondary | ICD-10-CM

## 2022-09-08 DIAGNOSIS — G43009 Migraine without aura, not intractable, without status migrainosus: Secondary | ICD-10-CM

## 2022-09-08 DIAGNOSIS — F411 Generalized anxiety disorder: Secondary | ICD-10-CM

## 2022-09-08 MED ORDER — BUPROPION HCL ER (XL) 150 MG PO TB24: 150.0000 mg | ORAL_TABLET | Freq: Every day | ORAL | 5 refills | Status: DC

## 2022-09-08 NOTE — Assessment & Plan Note (Signed)
Takes Hyoscyamine PRN for abdominal cramping She reports episodes of constipation, advised to take MiraLAX as needed and to avoid hyoscyamine when she has constipation

## 2022-09-08 NOTE — Progress Notes (Signed)
Established Patient Office Visit  Subjective:  Patient ID: Kim [name], female    DOB: 05/18/1996  Age: 27 y.o. MRN: [id number]  CC:  Chief Complaint  Patient presents with   Anxiety    Six month follow up    HPI Kim [name] is a 27 y.o. female with past medical history of migraine, GAD and chronic diarrhea who presents for f/u of her chronic medical conditions.  GAD: She is feeling better with Wellbutrin, except few episodes of anxiety at times.  She has tried Zoloft, Lexapro and BuSpar, but did not help much.  She has history of panic episode in the past.  Denies any recent panic episode.  Denies any SI or HI currently.  IBS: She has history of chronic diarrhea from IBS.  She takes hyoscyamine as needed for diarrhea and abdominal cramping.  Of note, she has been having constipation recently.  Denies any melena or hematochezia.  She has tried Dulcolax in the past.  She did make MiraLAX as needed for constipation, which has helped her.  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 Form has been filled out and provided to the patient.   Past Medical History:  Diagnosis Date   ADHD (attention deficit hyperactivity disorder)    Anxiety    Depression    Encounter for gynecological examination with Papanicolaou smear of cervix 03/27/2017   Encounter for surveillance of contraceptive pills 03/27/2017   GERD (gastroesophageal reflux disease)    Migraines     Past Surgical History:  Procedure Laterality Date   BIOPSY  01/06/2021   Procedure: BIOPSY;  Surgeon: Rogene Houston, MD;  Location: [facility];  Service: Endoscopy;;   COLONOSCOPY WITH PROPOFOL N/A 01/06/2021   Procedure: COLONOSCOPY WITH PROPOFOL;  Surgeon: Rogene Houston, MD;  Location: [facility];  Service: Endoscopy;  Laterality: N/A;  1:20   ESOPHAGOGASTRODUODENOSCOPY (EGD) WITH PROPOFOL N/A 01/06/2021   Procedure: ESOPHAGOGASTRODUODENOSCOPY (EGD) WITH PROPOFOL;  Surgeon: Rogene Houston, MD;  Location: [facility];  Service: Endoscopy;  Laterality: N/A;    Family History  Problem Relation Age of Onset   Cancer Paternal Grandfather    Alzheimer's disease Paternal Grandmother    Cancer Maternal Grandmother        breast    Cancer Maternal Grandfather        pancreatic and bone   Cancer Father        lung   Hypertension Mother     Social History   Socioeconomic History   Marital status: Single    Spouse name: Not on file   Number of children: Not on file   Years of education: Not on file   Highest education level: Associate degree: occupational, Hotel manager, or vocational program  Occupational History   Not on file  Tobacco Use   Smoking status: Never   Smokeless tobacco: Never  Vaping Use   Vaping Use: Never used  Substance and Sexual Activity   Alcohol use: Yes    Comment: monthly   Drug use: No   Sexual activity: Yes    Birth control/protection: I.U.D.  Other Topics Concern   Not on file  Social History Narrative   Lives with mom, step dad      Pets: 3 dogs: Skimp, Ginger, and Evie May      Enjoys: read-genre: poetry, movies and music      Diet: eats all food groups, snacks with stress   Caffeine:  coffee-2 cups; tea daily, mountain dew    Water: 1-2 cups daily       Does not wear seat belt due to being anxiety    Does not use phone while driving   Smoke detectors at home    Social Determinants of Health   Financial Resource Strain: Not on file  Food Insecurity: Not on file  Transportation Needs: Not on file  Physical Activity: Not on file  Stress: Not on file  Social Connections: Not on file  Intimate Partner Violence: Not on file    Outpatient Medications Prior to Visit  Medication Sig Dispense Refill   lidocaine (XYLOCAINE) 2 % solution Use as directed 15 mLs in the mouth or throat as needed for mouth pain. 100 mL 0   Norethindrone-Ethinyl Estradiol-Fe Biphas (LO LOESTRIN FE) 1 MG-10 MCG / 10 MCG tablet Take by mouth.      citalopram (CELEXA) 20 MG tablet Take by mouth.     acetaminophen (TYLENOL) 500 MG tablet Take 500-1,000 mg by mouth every 6 (six) hours as needed for moderate pain or headache.     hydrOXYzine (VISTARIL) 25 MG capsule Take 1 capsule (25 mg total) by mouth every 8 (eight) hours as needed for anxiety. 30 capsule 3   hyoscyamine (LEVBID) 0.375 MG 12 hr tablet Take 1 tablet (0.375 mg total) by mouth daily before breakfast. 30 tablet 5   ibuprofen (ADVIL) 100 MG/5ML suspension Take 30 mLs (600 mg total) by mouth every 8 (eight) hours as needed for moderate pain or fever. 500 mL 1   SUMAtriptan (IMITREX) 100 MG tablet Take 1 tablet (100 mg total) by mouth every 2 (two) hours as needed for migraine. May repeat in 2 hours if headache persists or recurs. 10 tablet 2   triamcinolone (KENALOG) 0.1 % paste Use as directed 1 Application in the mouth or throat 2 (two) times daily. 5 g 0   Vitamin D, Ergocalciferol, (DRISDOL) 1.25 MG (50000 UNIT) CAPS capsule Take 1 capsule (50,000 Units total) by mouth every 7 (seven) days. 12 capsule 1   buPROPion (WELLBUTRIN XL) 150 MG 24 hr tablet Take 1 tablet (150 mg total) by mouth daily. 30 tablet 5   No facility-administered medications prior to visit.    Allergies  Allergen Reactions   Amoxicillin Rash   Penicillins Rash   Doxycycline Hives   Dicyclomine Rash   Latex Itching    ROS Review of Systems  Constitutional:  Positive for fatigue. Negative for chills and fever.  HENT:  Negative for congestion, sinus pressure, sinus pain and sore throat.   Eyes:  Negative for pain and discharge.  Respiratory:  Negative for cough and shortness of breath.   Cardiovascular:  Negative for chest pain and palpitations.  Gastrointestinal:  Positive for constipation and nausea. Negative for vomiting.  Endocrine: Negative for polydipsia and polyuria.  Genitourinary:  Negative for dysuria and hematuria.  Musculoskeletal:  Positive for back pain. Negative for neck pain and  neck stiffness.  Skin:  Negative for rash.  Neurological:  Negative for dizziness and weakness.  Psychiatric/Behavioral:  Negative for agitation and behavioral problems. The patient is nervous/anxious.       Objective:    Physical Exam Vitals reviewed.  Constitutional:      General: She is not in acute distress.    Appearance: She is not diaphoretic.  HENT:     Head: Normocephalic and atraumatic.     Nose: Nose normal. No congestion.     Mouth/Throat:  Mouth: Mucous membranes are moist.     Pharynx: No posterior oropharyngeal erythema.  Eyes:     General: No scleral icterus.    Extraocular Movements: Extraocular movements intact.  Cardiovascular:     Rate and Rhythm: Normal rate and regular rhythm.     Pulses: Normal pulses.     Heart sounds: Normal heart sounds. No murmur heard. Pulmonary:     Breath sounds: Normal breath sounds. No wheezing or rales.  Musculoskeletal:     Cervical back: Neck supple. No tenderness.     Right lower leg: No edema.     Left lower leg: No edema.  Skin:    General: Skin is warm.     Findings: No rash.  Neurological:     General: No focal deficit present.     Mental Status: She is alert and oriented to person, place, and time.     Sensory: No sensory deficit.     Motor: Tremor present. No weakness.  Psychiatric:        Mood and Affect: Mood normal.        Behavior: Behavior normal.     BP 126/76 (BP Location: Left Arm, Cuff Size: Normal)   Pulse 91   Ht '5\' 11"'$  (1.803 m)   Wt 186 lb 6.4 oz (84.6 kg)   SpO2 99%   BMI 26.00 kg/m  Wt Readings from Last 3 Encounters:  09/08/22 186 lb 6.4 oz (84.6 kg)  03/08/22 182 lb 12.8 oz (82.9 kg)  11/17/21 174 lb (78.9 kg)    Lab Results  Component Value Date   TSH 1.730 03/07/2022   Lab Results  Component Value Date   WBC 6.7 03/07/2022   HGB 14.2 03/07/2022   HCT 42.7 03/07/2022   MCV 93 03/07/2022   PLT 231 03/07/2022   Lab Results  Component Value Date   NA 139 03/07/2022    K 4.4 03/07/2022   CO2 22 03/07/2022   GLUCOSE 87 03/07/2022   BUN 8 03/07/2022   CREATININE 0.70 03/07/2022   BILITOT 0.6 03/07/2022   ALKPHOS 65 03/07/2022   AST 12 03/07/2022   ALT 9 03/07/2022   PROT 7.6 03/07/2022   ALBUMIN 4.8 03/07/2022   CALCIUM 9.5 03/07/2022   ANIONGAP 11 03/26/2021   EGFR 122 03/07/2022   Lab Results  Component Value Date   CHOL 143 03/07/2022   Lab Results  Component Value Date   HDL 45 03/07/2022   Lab Results  Component Value Date   LDLCALC 85 03/07/2022   Lab Results  Component Value Date   TRIG 61 03/07/2022   Lab Results  Component Value Date   CHOLHDL 3.2 03/07/2022   Lab Results  Component Value Date   HGBA1C 4.9 03/07/2022      Assessment & Plan:   Problem List Items Addressed This Visit       Cardiovascular and Mediastinum   Migraine    Has intermittent episodic episodes of migraine, but once every 2-3 months Maxalt was ineffective Has sumatriptan as needed for now      Relevant Medications   buPROPion (WELLBUTRIN XL) 150 MG 24 hr tablet     Digestive   Irritable bowel syndrome with both constipation and diarrhea - Primary    Takes Hyoscyamine PRN for abdominal cramping She reports episodes of constipation, advised to take MiraLAX as needed and to avoid hyoscyamine when she has constipation        Other   GAD (generalized anxiety disorder)  Well controlled with Wellbutrin overall Has episodes of anxiety at times based on situation, has Vistaril PRN Was uncontrolled with Lexapro Has tried Zoloft, had GI side effects with it      Relevant Medications   buPROPion (WELLBUTRIN XL) 150 MG 24 hr tablet    Meds ordered this encounter  Medications   buPROPion (WELLBUTRIN XL) 150 MG 24 hr tablet    Sig: Take 1 tablet (150 mg total) by mouth daily.    Dispense:  30 tablet    Refill:  5    Follow-up: Return in about 6 months (around 03/09/2023) for GAD and migraine.    Lindell Spar, MD

## 2022-09-08 NOTE — Patient Instructions (Signed)
Please continue taking medications as prescribed.  Please continue to perform relaxation exercises/deep breathing techniques to reduce stress/anxiety.

## 2022-09-08 NOTE — Assessment & Plan Note (Addendum)
Well controlled with Wellbutrin overall Has episodes of anxiety at times based on situation, has Vistaril PRN Was uncontrolled with Lexapro Has tried Zoloft, had GI side effects with it

## 2022-09-08 NOTE — Assessment & Plan Note (Signed)
Has intermittent episodic episodes of migraine, but once every 2-3 months Maxalt was ineffective Has sumatriptan as needed for now

## 2023-01-16 LAB — HM PAP SMEAR: HM Pap smear: NEGATIVE

## 2023-03-09 ENCOUNTER — Ambulatory Visit: Payer: Self-pay | Admitting: Internal Medicine

## 2023-03-22 IMAGING — CT CT ANGIO CHEST
2 of 6 series · 19 of 36 positions shown · IV contrast (Omnipaque or Isovue)
Comparison: None.

CLINICAL DATA: Shortness of breath.  [id number].

EXAM:
CT ANGIOGRAPHY CHEST WITH CONTRAST
TECHNIQUE: Multidetector CT imaging of the chest was performed using the
standard protocol during bolus administration of intravenous
contrast. Multiplanar CT image reconstructions and MIPs were
obtained to evaluate the vascular anatomy.
CONTRAST:  75mL OMNIPAQUE IOHEXOL 350 MG/ML SOLN

[Series 6: pe axial thins · axial · 0.61mm/px · z∈[+1303,+1600]mm · 18 of 411 slices shown]
[im 20/411  lung]
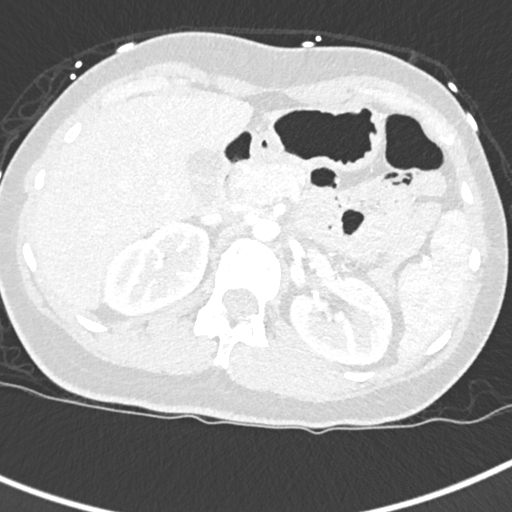
[im 40/411  mediastinal]
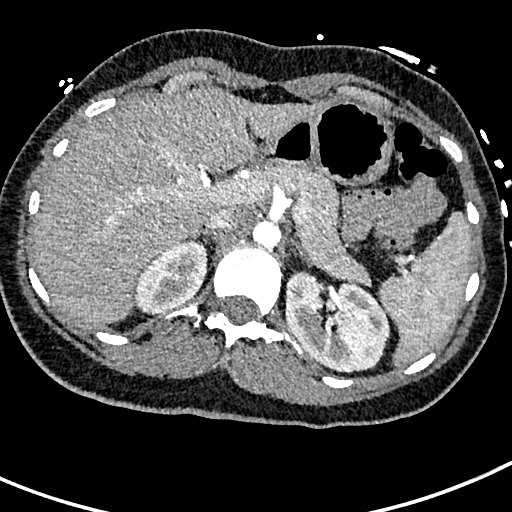
[im 59/411  lung]
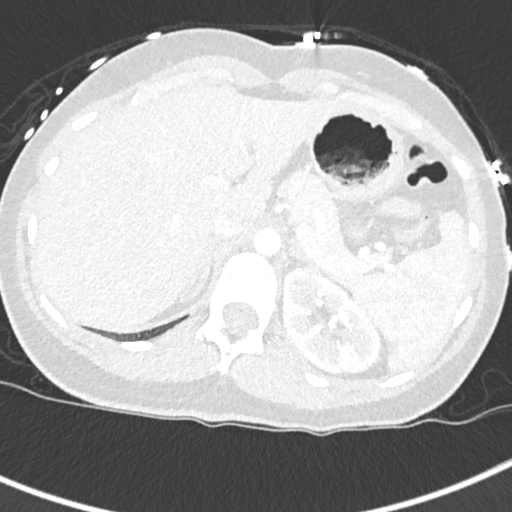
[im 79/411  mediastinal]
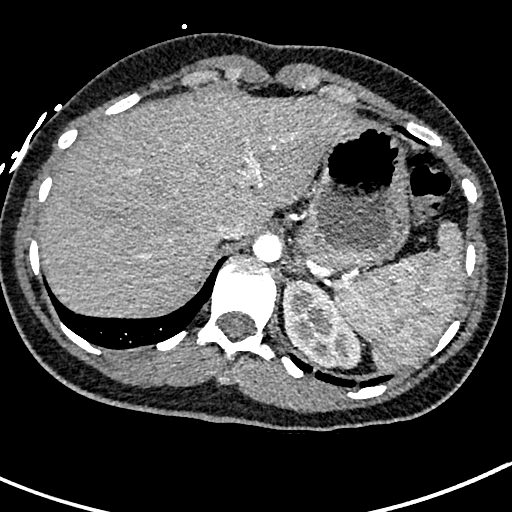
[im 118/411  lung]
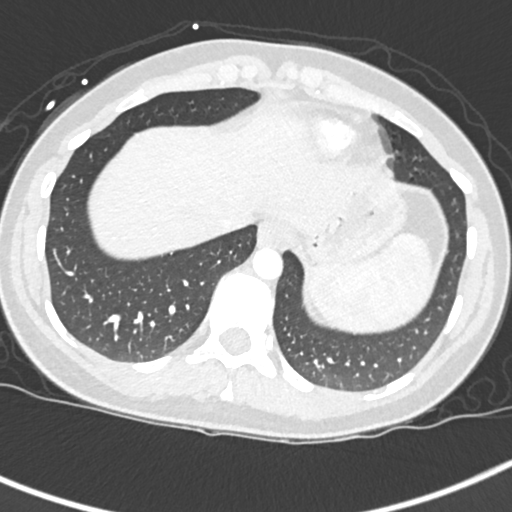
[im 137/411  mediastinal]
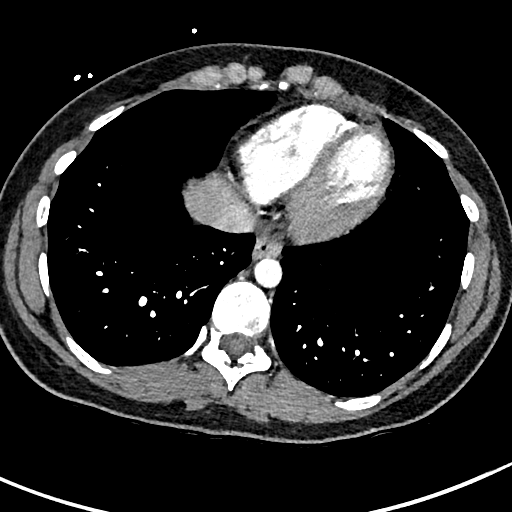
[im 157/411  lung]
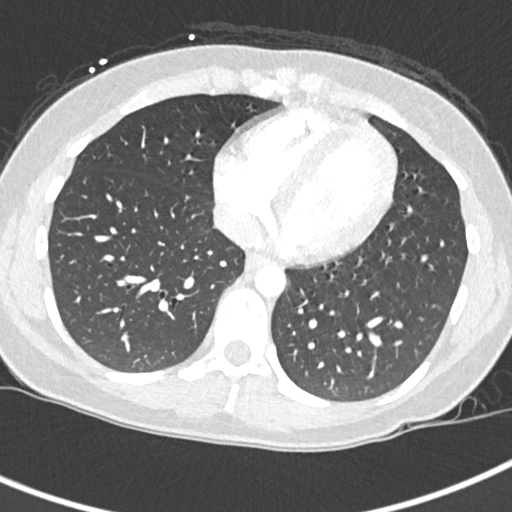
[im 176/411  mediastinal]
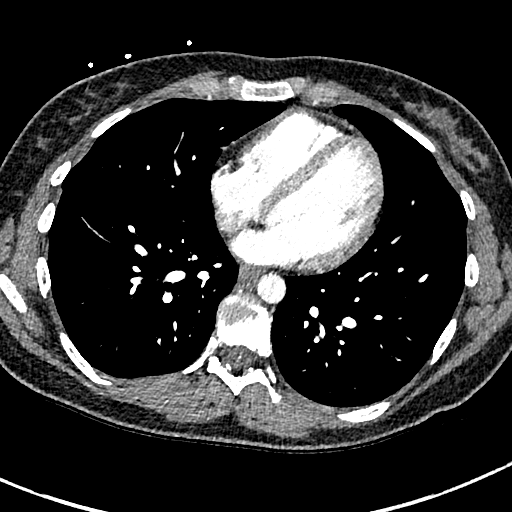
[im 196/411  lung]
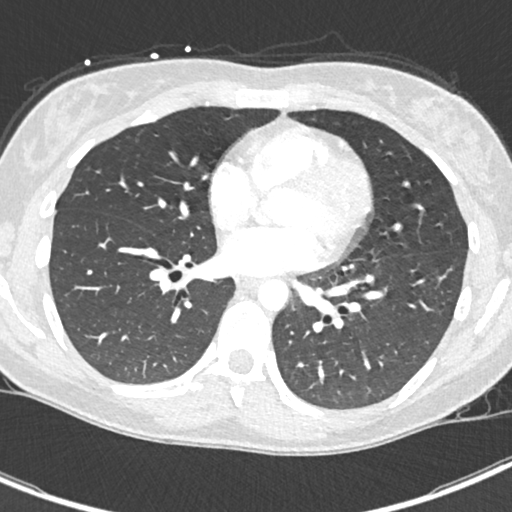
[im 215/411  mediastinal]
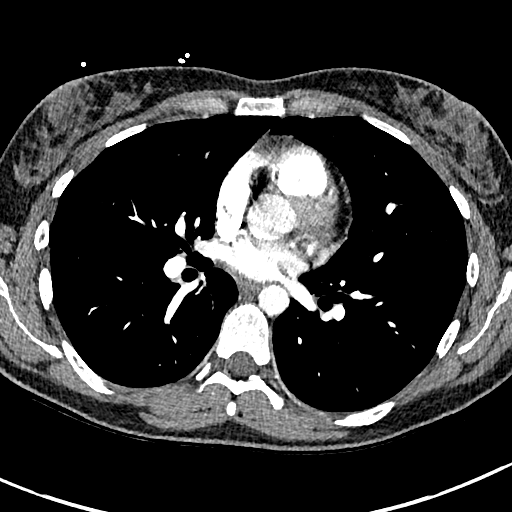
[im 235/411  lung]
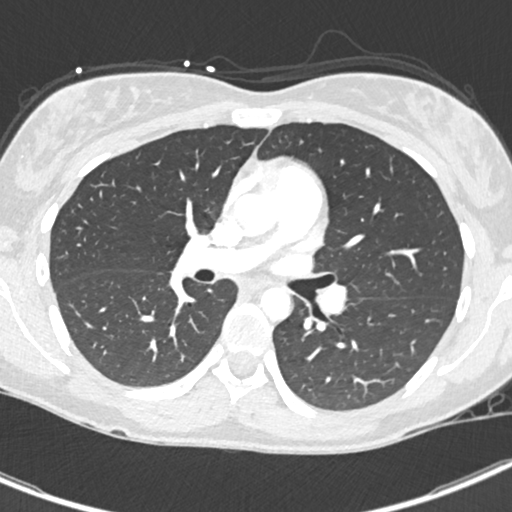
[im 254/411  mediastinal]
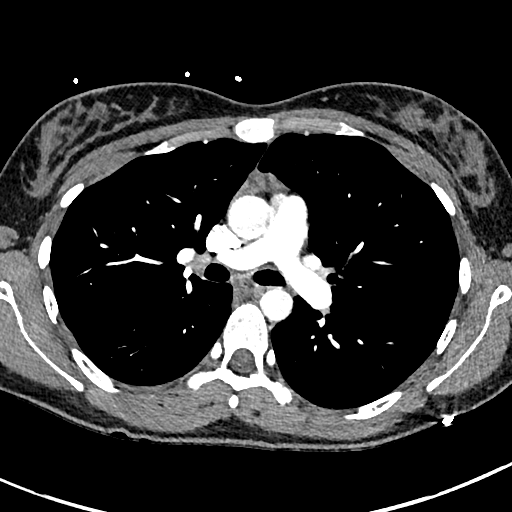
[im 274/411  lung]
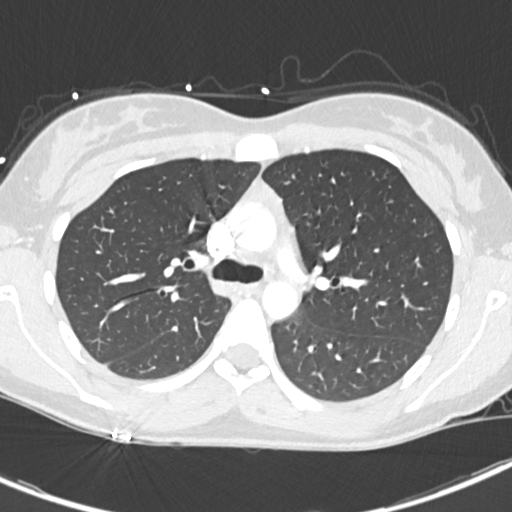
[im 313/411  mediastinal]
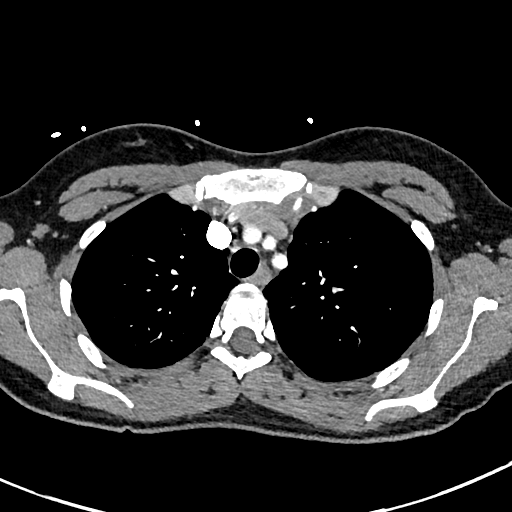
[im 332/411  lung]
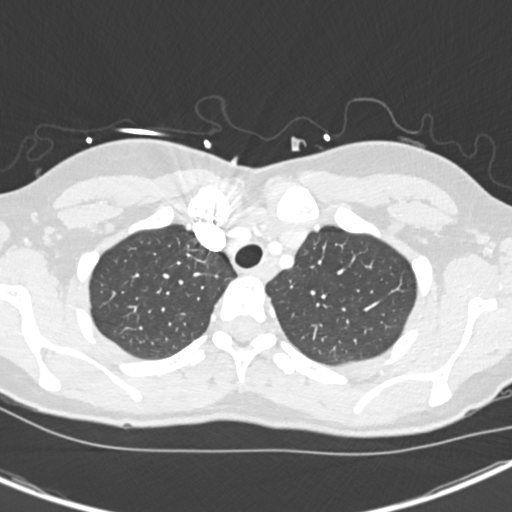
[im 352/411  mediastinal]
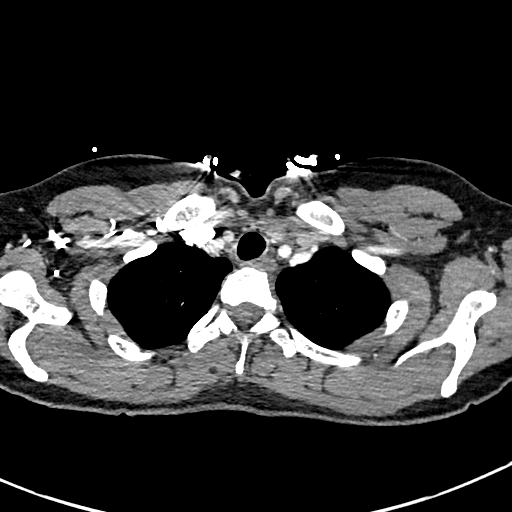
[im 371/411  lung]
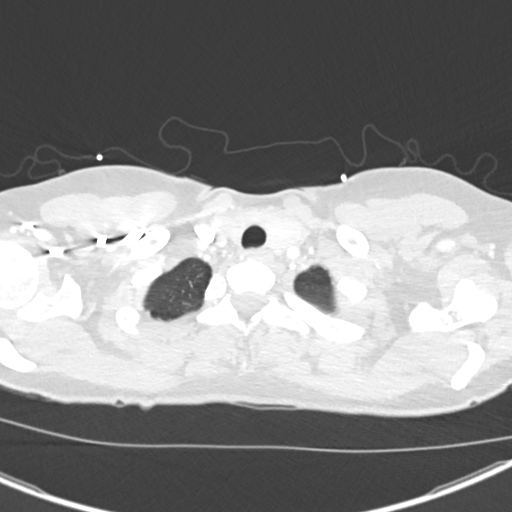
[im 391/411  mediastinal]
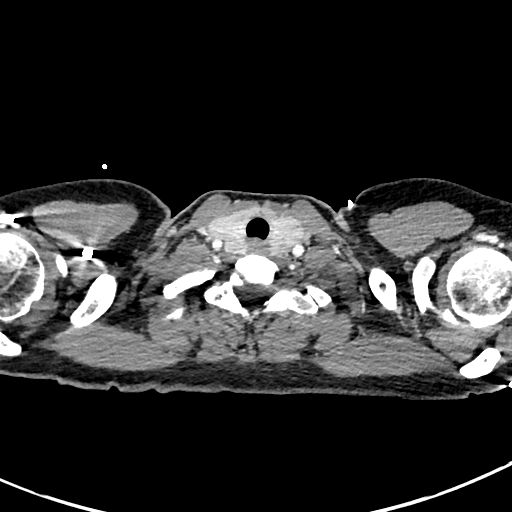

[Series 8: cor soft · coronal · 0.64mm/px · 1 of 132 slices shown]
[im 66/132  mediastinal]
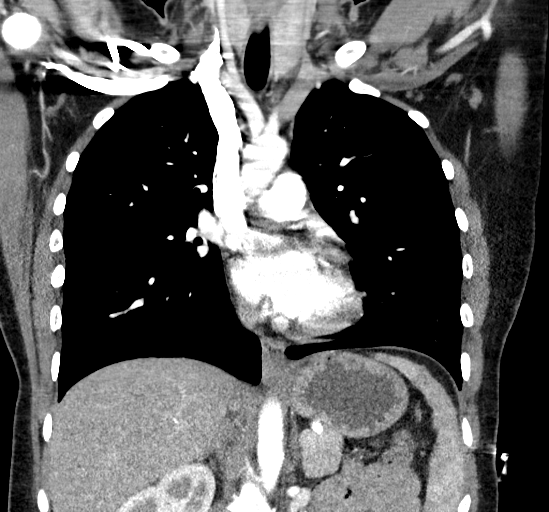

[19 of 36 positions shown; findings below may reference images not displayed]

FINDINGS: Cardiovascular: Contrast injection is sufficient to demonstrate
satisfactory opacification of the pulmonary arteries to the
segmental level. There is no pulmonary embolus or evidence of right
heart strain. The size of the main pulmonary artery is normal. Heart
size is normal, with no pericardial effusion. The course and caliber
of the aorta are normal. There is no atherosclerotic calcification.
Opacification decreased due to pulmonary arterial phase contrast
bolus timing.

Mediastinum/Nodes: No mediastinal, hilar or axillary
lymphadenopathy. Normal visualized thyroid. Thoracic esophageal
course is normal.

Lungs/Pleura: Airways are patent. No pleural effusion, lobar
consolidation, pneumothorax or pulmonary infarction.

Upper Abdomen: Contrast bolus timing is not optimized for evaluation
of the abdominal organs. The visualized portions of the organs of
the upper abdomen are normal.

Musculoskeletal: No chest wall abnormality. No bony spinal canal
stenosis.

Review of the MIP images confirms the above findings.
IMPRESSION: No pulmonary embolus or other acute thoracic abnormality.

## 2023-03-24 ENCOUNTER — Ambulatory Visit: Payer: Self-pay | Admitting: Internal Medicine

## 2023-03-24 ENCOUNTER — Encounter: Payer: Self-pay | Admitting: Internal Medicine

## 2023-03-24 VITALS — BP 135/83 | HR 89 | Ht 70.5 in | Wt 192.8 lb

## 2023-03-24 DIAGNOSIS — F411 Generalized anxiety disorder: Secondary | ICD-10-CM

## 2023-03-24 DIAGNOSIS — G43009 Migraine without aura, not intractable, without status migrainosus: Secondary | ICD-10-CM

## 2023-03-24 DIAGNOSIS — K582 Mixed irritable bowel syndrome: Secondary | ICD-10-CM

## 2023-03-24 DIAGNOSIS — F410 Panic disorder [episodic paroxysmal anxiety] without agoraphobia: Secondary | ICD-10-CM

## 2023-03-24 MED ORDER — HYOSCYAMINE SULFATE ER 0.375 MG PO TB12: 0.3750 mg | ORAL_TABLET | Freq: Two times a day (BID) | ORAL | 2 refills | Status: DC | PRN

## 2023-03-24 MED ORDER — HYOSCYAMINE SULFATE ER 0.375 MG PO TB12: 0.3750 mg | ORAL_TABLET | Freq: Every day | ORAL | 2 refills | Status: DC

## 2023-03-24 MED ORDER — VENLAFAXINE HCL ER 37.5 MG PO CP24: 37.5000 mg | ORAL_CAPSULE | Freq: Every day | ORAL | 2 refills | Status: DC

## 2023-03-24 MED ORDER — HYDROXYZINE PAMOATE 25 MG PO CAPS: 25.0000 mg | ORAL_CAPSULE | Freq: Three times a day (TID) | ORAL | 2 refills | Status: AC | PRN

## 2023-03-24 MED ORDER — SUMATRIPTAN SUCCINATE 100 MG PO TABS: 100.0000 mg | ORAL_TABLET | ORAL | 2 refills | Status: DC | PRN

## 2023-03-24 NOTE — Assessment & Plan Note (Signed)
Has intermittent episodic episodes of migraine, but once every 2-3 months Maxalt was ineffective Has sumatriptan as needed for now

## 2023-03-24 NOTE — Assessment & Plan Note (Signed)
Added Effexor for GAD Added Vistaril as needed for spells of anxiety/panic episode

## 2023-03-24 NOTE — Patient Instructions (Signed)
Please start taking Venlafaxine instead of Bupropion.  Please take Vistaril as needed for anxiety.  Please continue to follow low salt diet and perform moderate exercise/walking at least 150 mins/week.

## 2023-03-24 NOTE — Assessment & Plan Note (Signed)
Takes Hyoscyamine PRN for abdominal cramping She reports episodes of constipation, advised to take MiraLAX as needed and to avoid hyoscyamine when she has constipation

## 2023-03-24 NOTE — Progress Notes (Signed)
Established Patient Office Visit  Subjective:  Patient ID: Kim [name], female    DOB: 1996-03-11  Age: 27 y.o. MRN: [id number]  CC:  Chief Complaint  Patient presents with   GAD    Follow up   Migraine    Follow up    HPI Kim [name] is a 27 y.o. female with past medical history of migraine, GAD and chronic diarrhea who presents for f/u of her chronic medical conditions.  GAD: She was feeling better with Wellbutrin, except few episodes of anxiety at times. But she had to stop it due to constipation. She has tried Zoloft, Lexapro and BuSpar, but did not help much.  She has history of panic episode in the past.  Denies any recent panic episode.  Denies any SI or HI currently.  IBS: She has history of chronic diarrhea from IBS.  She takes hyoscyamine as needed for diarrhea and abdominal cramping.  Of note, she has been having constipation recently.  Denies any melena or hematochezia.  She has tried Dulcolax in the past.  She did make MiraLAX as needed for constipation, which has helped her.    Past Medical History:  Diagnosis Date   ADHD (attention deficit hyperactivity disorder)    Anxiety    Depression    Encounter for gynecological examination with Papanicolaou smear of cervix 03/27/2017   Encounter for surveillance of contraceptive pills 03/27/2017   GERD (gastroesophageal reflux disease)    Migraines     Past Surgical History:  Procedure Laterality Date   BIOPSY  01/06/2021   Procedure: BIOPSY;  Surgeon: Malissa Hippo, MD;  [facility]: [facility];  Service: Endoscopy;;   COLONOSCOPY WITH PROPOFOL N/A 01/06/2021   Procedure: COLONOSCOPY WITH PROPOFOL;  Surgeon: Malissa Hippo, MD;  [facility];  Service: Endoscopy;  Laterality: N/A;  1:20   ESOPHAGOGASTRODUODENOSCOPY (EGD) WITH PROPOFOL N/A 01/06/2021   Procedure: ESOPHAGOGASTRODUODENOSCOPY (EGD) WITH PROPOFOL;  Surgeon: Malissa Hippo, MD;  [facility];  Service: Endoscopy;   Laterality: N/A;    Family History  Problem Relation Age of Onset   Cancer Paternal Grandfather    Alzheimer's disease Paternal Grandmother    Cancer Maternal Grandmother        breast    Cancer Maternal Grandfather        pancreatic and bone   Cancer Father        lung   Hypertension Mother     Social History   Socioeconomic History   Marital status: Single    Spouse name: Not on file   Number of children: Not on file   Years of education: Not on file   Highest education level: Associate degree: occupational, Scientist, product/process development, or vocational program  Occupational History   Not on file  Tobacco Use   Smoking status: Never   Smokeless tobacco: Never  Vaping Use   Vaping status: Never Used  Substance and Sexual Activity   Alcohol use: Yes    Comment: monthly   Drug use: No   Sexual activity: Yes    Birth control/protection: I.U.D.  Other Topics Concern   Not on file  Social History Narrative   Lives with mom, step dad      Pets: 3 dogs: Skimp, Ginger, and Evie May      Enjoys: read-genre: poetry, movies and music      Diet: eats all food groups, snacks with stress   Caffeine: coffee-2 cups; tea daily, mountain dew  Water: 1-2 cups daily       Does not wear seat belt due to being anxiety    Does not use phone while driving   Psychologist, sport and exercise at home    Social Determinants of Health   Financial Resource Strain: Not on file  Food Insecurity: Not on file  Transportation Needs: Not on file  Physical Activity: Not on file  Stress: Not on file  Social Connections: Not on file  Intimate Partner Violence: Not on file    Outpatient Medications Prior to Visit  Medication Sig Dispense Refill   acetaminophen (TYLENOL) 500 MG tablet Take 500-1,000 mg by mouth every 6 (six) hours as needed for moderate pain or headache.     ibuprofen (ADVIL) 100 MG/5ML suspension Take 30 mLs (600 mg total) by mouth every 8 (eight) hours as needed for moderate pain or fever. 500 mL 1    buPROPion (WELLBUTRIN XL) 150 MG 24 hr tablet Take 1 tablet (150 mg total) by mouth daily. 30 tablet 5   hydrOXYzine (VISTARIL) 25 MG capsule Take 1 capsule (25 mg total) by mouth every 8 (eight) hours as needed for anxiety. 30 capsule 3   hyoscyamine (LEVBID) 0.375 MG 12 hr tablet Take 1 tablet (0.375 mg total) by mouth daily before breakfast. 30 tablet 5   lidocaine (XYLOCAINE) 2 % solution Use as directed 15 mLs in the mouth or throat as needed for mouth pain. 100 mL 0   Norethindrone-Ethinyl Estradiol-Fe Biphas (LO LOESTRIN FE) 1 MG-10 MCG / 10 MCG tablet Take by mouth.     SUMAtriptan (IMITREX) 100 MG tablet Take 1 tablet (100 mg total) by mouth every 2 (two) hours as needed for migraine. May repeat in 2 hours if headache persists or recurs. 10 tablet 2   triamcinolone (KENALOG) 0.1 % paste Use as directed 1 Application in the mouth or throat 2 (two) times daily. 5 g 0   Vitamin D, Ergocalciferol, (DRISDOL) 1.25 MG (50000 UNIT) CAPS capsule Take 1 capsule (50,000 Units total) by mouth every 7 (seven) days. 12 capsule 1   No facility-administered medications prior to visit.    Allergies  Allergen Reactions   Amoxicillin Rash   Penicillins Rash   Doxycycline Hives   Dicyclomine Rash   Latex Itching    ROS Review of Systems  Constitutional:  Positive for fatigue. Negative for chills and fever.  HENT:  Negative for congestion, sinus pressure, sinus pain and sore throat.   Eyes:  Negative for pain and discharge.  Respiratory:  Negative for cough and shortness of breath.   Cardiovascular:  Negative for chest pain and palpitations.  Gastrointestinal:  Positive for constipation. Negative for nausea and vomiting.  Endocrine: Negative for polydipsia and polyuria.  Genitourinary:  Negative for dysuria and hematuria.  Musculoskeletal:  Negative for neck pain and neck stiffness.  Skin:  Negative for rash.  Neurological:  Negative for dizziness and weakness.  Psychiatric/Behavioral:   Negative for agitation and behavioral problems. The patient is nervous/anxious.       Objective:    Physical Exam Vitals reviewed.  Constitutional:      General: She is not in acute distress.    Appearance: She is not diaphoretic.  HENT:     Head: Normocephalic and atraumatic.     Nose: Nose normal. No congestion.     Mouth/Throat:     Mouth: Mucous membranes are moist.     Pharynx: No posterior oropharyngeal erythema.  Eyes:     General:  No scleral icterus.    Extraocular Movements: Extraocular movements intact.  Cardiovascular:     Rate and Rhythm: Normal rate and regular rhythm.     Pulses: Normal pulses.     Heart sounds: Normal heart sounds. No murmur heard. Pulmonary:     Breath sounds: Normal breath sounds. No wheezing or rales.  Abdominal:     Palpations: Abdomen is soft.     Tenderness: There is no abdominal tenderness.  Musculoskeletal:     Cervical back: Neck supple. No tenderness.     Right lower leg: No edema.     Left lower leg: No edema.  Skin:    General: Skin is warm.     Findings: No rash.  Neurological:     General: No focal deficit present.     Mental Status: She is alert and oriented to person, place, and time.     Sensory: No sensory deficit.     Motor: Tremor present. No weakness.  Psychiatric:        Mood and Affect: Mood normal.        Behavior: Behavior normal.     BP 135/83   Pulse 89   Ht 5' 10.5" (1.791 m)   Wt 192 lb 12.8 oz (87.5 kg)   SpO2 99%   BMI 27.27 kg/m  Wt Readings from Last 3 Encounters:  03/24/23 192 lb 12.8 oz (87.5 kg)  09/08/22 186 lb 6.4 oz (84.6 kg)  03/08/22 182 lb 12.8 oz (82.9 kg)    Lab Results  Component Value Date   TSH 1.730 03/07/2022   Lab Results  Component Value Date   WBC 6.7 03/07/2022   HGB 14.2 03/07/2022   HCT 42.7 03/07/2022   MCV 93 03/07/2022   PLT 231 03/07/2022   Lab Results  Component Value Date   NA 139 03/07/2022   K 4.4 03/07/2022   CO2 22 03/07/2022   GLUCOSE 87  03/07/2022   BUN 8 03/07/2022   CREATININE 0.70 03/07/2022   BILITOT 0.6 03/07/2022   ALKPHOS 65 03/07/2022   AST 12 03/07/2022   ALT 9 03/07/2022   PROT 7.6 03/07/2022   ALBUMIN 4.8 03/07/2022   CALCIUM 9.5 03/07/2022   ANIONGAP 11 03/26/2021   EGFR 122 03/07/2022   Lab Results  Component Value Date   CHOL 143 03/07/2022   Lab Results  Component Value Date   HDL 45 03/07/2022   Lab Results  Component Value Date   LDLCALC 85 03/07/2022   Lab Results  Component Value Date   TRIG 61 03/07/2022   Lab Results  Component Value Date   CHOLHDL 3.2 03/07/2022   Lab Results  Component Value Date   HGBA1C 4.9 03/07/2022      Assessment & Plan:   Problem List Items Addressed This Visit       Cardiovascular and Mediastinum   Migraine    Has intermittent episodic episodes of migraine, but once every 2-3 months Maxalt was ineffective Has sumatriptan as needed for now      Relevant Medications   venlafaxine XR (EFFEXOR XR) 37.5 MG 24 hr capsule   SUMAtriptan (IMITREX) 100 MG tablet     Digestive   Irritable bowel syndrome with both constipation and diarrhea    Takes Hyoscyamine PRN for abdominal cramping She reports episodes of constipation, advised to take MiraLAX as needed and to avoid hyoscyamine when she has constipation      Relevant Medications   hyoscyamine (LEVBID) 0.375 MG 12  hr tablet   Other Relevant Orders   CBC   CMP14+EGFR     Other   Panic attacks    Added Effexor for GAD Added Vistaril as needed for spells of anxiety/panic episode      Relevant Medications   venlafaxine XR (EFFEXOR XR) 37.5 MG 24 hr capsule   hydrOXYzine (VISTARIL) 25 MG capsule   GAD (generalized anxiety disorder) - Primary    Uncontrolled, had to stop Wellbutrin due to constipation Has episodes of anxiety at times based on situation, has Vistaril PRN Was uncontrolled with Lexapro Has tried Zoloft, had GI side effects with it Started Effexor 37.5 mg QD       Relevant Medications   venlafaxine XR (EFFEXOR XR) 37.5 MG 24 hr capsule   hydrOXYzine (VISTARIL) 25 MG capsule     Meds ordered this encounter  Medications   DISCONTD: hyoscyamine (LEVBID) 0.375 MG 12 hr tablet    Sig: Take 1 tablet (0.375 mg total) by mouth daily before breakfast.    Dispense:  30 tablet    Refill:  2   venlafaxine XR (EFFEXOR XR) 37.5 MG 24 hr capsule    Sig: Take 1 capsule (37.5 mg total) by mouth daily with breakfast.    Dispense:  30 capsule    Refill:  2   hyoscyamine (LEVBID) 0.375 MG 12 hr tablet    Sig: Take 1 tablet (0.375 mg total) by mouth every 12 (twelve) hours as needed for cramping.    Dispense:  30 tablet    Refill:  2   SUMAtriptan (IMITREX) 100 MG tablet    Sig: Take 1 tablet (100 mg total) by mouth every 2 (two) hours as needed for migraine. May repeat in 2 hours if headache persists or recurs.    Dispense:  10 tablet    Refill:  2   hydrOXYzine (VISTARIL) 25 MG capsule    Sig: Take 1 capsule (25 mg total) by mouth every 8 (eight) hours as needed for anxiety.    Dispense:  30 capsule    Refill:  2    Follow-up: Return in about 6 months (around 09/21/2023).    Anabel Halon, MD

## 2023-03-24 NOTE — Assessment & Plan Note (Signed)
Uncontrolled, had to stop Wellbutrin due to constipation Has episodes of anxiety at times based on situation, has Vistaril PRN Was uncontrolled with Lexapro Has tried Zoloft, had GI side effects with it Started Effexor 37.5 mg QD

## 2023-03-25 LAB — CMP14+EGFR
ALT: 10 IU/L (ref 0–32)
AST: 13 IU/L (ref 0–40)
Albumin: 4.5 g/dL (ref 4.0–5.0)
Alkaline Phosphatase: 67 IU/L (ref 44–121)
BUN/Creatinine Ratio: 14 (ref 9–23)
BUN: 10 mg/dL (ref 6–20)
Bilirubin Total: 0.3 mg/dL (ref 0.0–1.2)
CO2: 21 mmol/L (ref 20–29)
Calcium: 9.5 mg/dL (ref 8.7–10.2)
Chloride: 104 mmol/L (ref 96–106)
Creatinine, Ser: 0.72 mg/dL (ref 0.57–1.00)
Globulin, Total: 3 g/dL (ref 1.5–4.5)
Glucose: 91 mg/dL (ref 70–99)
Potassium: 4.6 mmol/L (ref 3.5–5.2)
Sodium: 141 mmol/L (ref 134–144)
Total Protein: 7.5 g/dL (ref 6.0–8.5)
eGFR: 117 mL/min/{1.73_m2} (ref >59)

## 2023-03-25 LAB — CBC
Hematocrit: 40.4 % (ref 34.0–46.6)
Hemoglobin: 13.5 g/dL (ref 11.1–15.9)
MCH: 30 pg (ref 26.6–33.0)
MCHC: 33.4 g/dL (ref 31.5–35.7)
MCV: 90 fL (ref 79–97)
Platelets: 229 10*3/uL (ref 150–450)
RBC: 4.5 x10E6/uL (ref 3.77–5.28)
RDW: 12.7 % (ref 11.7–15.4)
WBC: 7.9 10*3/uL (ref 3.4–10.8)

## 2023-06-23 ENCOUNTER — Other Ambulatory Visit: Payer: Self-pay

## 2023-06-23 ENCOUNTER — Ambulatory Visit: Payer: Self-pay | Admitting: Internal Medicine

## 2023-06-23 ENCOUNTER — Encounter (HOSPITAL_COMMUNITY): Payer: Self-pay

## 2023-06-23 ENCOUNTER — Emergency Department (HOSPITAL_COMMUNITY)
Admission: EM | Admit: 2023-06-23 | Discharge: 2023-06-23 | Disposition: A | Discharge status: Home / Self Care | Payer: Self-pay | Attending: Emergency Medicine | Admitting: Emergency Medicine

## 2023-06-23 DIAGNOSIS — N12 Tubulo-interstitial nephritis, not specified as acute or chronic: Secondary | ICD-10-CM | POA: Insufficient documentation

## 2023-06-23 DIAGNOSIS — Z91040 Latex allergy status: Secondary | ICD-10-CM | POA: Insufficient documentation

## 2023-06-23 LAB — URINALYSIS, ROUTINE W REFLEX MICROSCOPIC
Bilirubin Urine: NEGATIVE
Glucose, UA: NEGATIVE mg/dL
Hgb urine dipstick: NEGATIVE
Ketones, ur: NEGATIVE mg/dL
Nitrite: POSITIVE — AB
Protein, ur: NEGATIVE mg/dL
Specific Gravity, Urine: 1.011 (ref 1.005–1.030)
pH: 8 (ref 5.0–8.0)

## 2023-06-23 LAB — PREGNANCY, URINE: Preg Test, Ur: NEGATIVE

## 2023-06-23 MED ORDER — KETOROLAC TROMETHAMINE 30 MG/ML IJ SOLN
30.0000 mg | Freq: Once | INTRAMUSCULAR | Status: AC
  Administered 2023-06-23: 30 mg via INTRAMUSCULAR
  Filled 2023-06-23: qty 1

## 2023-06-23 MED ORDER — LIDOCAINE HCL (PF) 1 % IJ SOLN
2.0000 mL | Freq: Once | INTRAMUSCULAR | Status: AC
  Administered 2023-06-23: 2 mL
  Filled 2023-06-23: qty 5

## 2023-06-23 MED ORDER — SULFAMETHOXAZOLE-TRIMETHOPRIM 800-160 MG PO TABS: 1.0000 | ORAL_TABLET | Freq: Two times a day (BID) | ORAL | 0 refills | Status: AC

## 2023-06-23 MED ORDER — CEFTRIAXONE SODIUM 1 G IJ SOLR
1.0000 g | Freq: Once | INTRAMUSCULAR | Status: AC
  Administered 2023-06-23: 1 g via INTRAMUSCULAR
  Filled 2023-06-23: qty 10

## 2023-06-23 MED ORDER — DIPHENHYDRAMINE HCL 25 MG PO CAPS
25.0000 mg | ORAL_CAPSULE | Freq: Once | ORAL | Status: AC
  Administered 2023-06-23: 25 mg via ORAL
  Filled 2023-06-23: qty 1

## 2023-06-23 NOTE — Discharge Instructions (Addendum)
You were seen in the emergency department for back pain.  As we discussed your urine sample showed a urinary tract infection.  This plus the pain in your back as well as the fever that you had makes me worried about a kidney infection.  We gave you the initial dose of antibiotics as well as an anti-inflammatory.  I reviewed the literature regarding your venlafaxine, and there is no significant drug interactions with Tylenol, so you can take this as needed for pain.  I have prescribed the rest of the antibiotics to the pharmacy.  Continue to monitor how you are doing and return to the ER for any new or worsening symptoms such as inability to urinate, worsening pain, persistent fever despite medication.

## 2023-06-23 NOTE — ED Provider Notes (Signed)
[place] EMERGENCY DEPARTMENT AT [facility] Provider Note   CSN: [id number] Arrival date & time: 06/23/23  1356     History  Chief Complaint  Patient presents with   Back Pain    Kim [name] is a 27 y.o. female with history of anxiety, ADHD, depression, GERD, migraines, who presents the emergency department complaining of lower abdominal and back pain.  Symptoms have been going on for the past 2 to 3 days.  She has associated dysuria.  History of UTI and pyelonephritis.  Highest fever at home 101.7 F.  Has not taken anything for her symptoms as she was told with the antidepressant that she is on that taking certain over-the-counter medications could cause serotonin syndrome.   Back Pain Associated symptoms: dysuria and fever        Home Medications Prior to Admission medications   Medication Sig Start Date End Date Taking? Authorizing Provider  sulfamethoxazole-trimethoprim (BACTRIM DS) 800-160 MG tablet Take 1 tablet by mouth 2 (two) times daily for 14 days. 06/23/23 07/07/23 Yes Malayiah Mcbrayer T, PA-C  acetaminophen (TYLENOL) 500 MG tablet Take 500-1,000 mg by mouth every 6 (six) hours as needed for moderate pain or headache.    [provider]  hydrOXYzine (VISTARIL) 25 MG capsule Take 1 capsule (25 mg total) by mouth every 8 (eight) hours as needed for anxiety. 03/24/23   Anabel Halon, MD  hyoscyamine (LEVBID) 0.375 MG 12 hr tablet Take 1 tablet (0.375 mg total) by mouth every 12 (twelve) hours as needed for cramping. 03/24/23   Anabel Halon, MD  ibuprofen (ADVIL) 100 MG/5ML suspension Take 30 mLs (600 mg total) by mouth every 8 (eight) hours as needed for moderate pain or fever. 07/31/22   Wallis Bamberg, PA-C  SUMAtriptan (IMITREX) 100 MG tablet Take 1 tablet (100 mg total) by mouth every 2 (two) hours as needed for migraine. May repeat in 2 hours if headache persists or recurs. 03/24/23   Anabel Halon, MD  venlafaxine XR (EFFEXOR XR) 37.5 MG  24 hr capsule Take 1 capsule (37.5 mg total) by mouth daily with breakfast. 03/24/23   Anabel Halon, MD      Allergies    Amoxicillin, Penicillins, Doxycycline, Dicyclomine, and Latex    Review of Systems   Review of Systems  Constitutional:  Positive for fever.  Genitourinary:  Positive for dysuria. Negative for vaginal bleeding and vaginal discharge.  Musculoskeletal:  Positive for back pain.  All other systems reviewed and are negative.   Physical Exam Updated Vital Signs BP (!) 133/91   Pulse 99   Temp 98.4 F (36.9 C) (Oral)   Resp 18   Ht 5\' 10"  (1.778 m)   Wt 83.9 kg   LMP 06/09/2023   SpO2 100%   BMI 26.54 kg/m  Physical Exam Vitals and nursing note reviewed.  Constitutional:      Appearance: Normal appearance.  HENT:     Head: Normocephalic and atraumatic.  Eyes:     Conjunctiva/sclera: Conjunctivae normal.  Cardiovascular:     Rate and Rhythm: Normal rate and regular rhythm.  Pulmonary:     Effort: Pulmonary effort is normal. No respiratory distress.     Breath sounds: Normal breath sounds.  Abdominal:     General: There is no distension.     Palpations: Abdomen is soft.     Tenderness: There is abdominal tenderness in the suprapubic area. There is no right CVA tenderness or left CVA tenderness.  Skin:    General: Skin is warm and dry.  Neurological:     General: No focal deficit present.     Mental Status: She is alert.     ED Results / Procedures / Treatments   Labs (all labs ordered are listed, but only abnormal results are displayed) Labs Reviewed  URINALYSIS, ROUTINE W REFLEX MICROSCOPIC - Abnormal; Notable for the following components:      Result Value   APPearance HAZY (*)    Nitrite POSITIVE (*)    Leukocytes,Ua TRACE (*)    Bacteria, UA RARE (*)    All other components within normal limits  PREGNANCY, URINE    EKG None  Radiology No results found.  Procedures Procedures    Medications Ordered in ED Medications   cefTRIAXone (ROCEPHIN) injection 1 g (1 g Intramuscular Given 06/23/23 1605)  ketorolac (TORADOL) 30 MG/ML injection 30 mg (30 mg Intramuscular Given 06/23/23 1604)  lidocaine (PF) (XYLOCAINE) 1 % injection 2 mL (2 mLs Other Given 06/23/23 1604)  diphenhydrAMINE (BENADRYL) capsule 25 mg (25 mg Oral Given 06/23/23 1643)    ED Course/ Medical Decision Making/ A&P                                 Medical Decision Making Amount and/or Complexity of Data Reviewed Labs: ordered.  Risk Prescription drug management.   This patient is a 27 y.o. female  who presents to the ED for concern of dysuria, low back pain.   Differential diagnoses prior to evaluation: The emergent differential diagnosis includes, but is not limited to,  UTI, pyelonephritis,  PID, ectopic pregnancy. This is not an exhaustive differential.   Past Medical History / Co-morbidities / Social History: anxiety, ADHD, depression, GERD, migraines  Additional history: Chart reviewed. Pertinent results include: patient has tolerated cephalexin in the past per chart review  Physical Exam: Physical exam performed. The pertinent findings include: Initially tachycardic, but on my exam normal vital signs in no acute distress. Suprapubic tenderness to palpation.   Lab Tests/Imaging studies: I personally interpreted labs/imaging and the pertinent results include:  UA with positive nitrite, trace leukocytes, 11-20 WBCs, and rare bacteria. Urine preg negative.   Medications: I ordered medication including rocephin and toradol, benadryl after patient developed some rash. Unsure if related to toradol or rocephin. Rash improved after benadryl, no difficulty breathing, no angioedema.  I have reviewed the patients home medicines and have made adjustments as needed.   Disposition: After consideration of the diagnostic results and the patients response to treatment, I feel that emergency department workup does not suggest an emergent  condition requiring admission or immediate intervention beyond what has been performed at this time. The plan is: discharge to home with antibiotics for likely pyelonephritis. Does not meet SIRS, clinically well appearing. The patient is safe for discharge and has been instructed to return immediately for worsening symptoms, change in symptoms or any other concerns.  Final Clinical Impression(s) / ED Diagnoses Final diagnoses:  Pyelonephritis    Rx / DC Orders ED Discharge Orders          Ordered    sulfamethoxazole-trimethoprim (BACTRIM DS) 800-160 MG tablet  2 times daily        06/23/23 1649           Portions of this report may have been transcribed using voice recognition software. Every effort was made to ensure accuracy; however, inadvertent computerized transcription errors  may be present.    Jeanella Flattery 06/23/23 1716    Vanetta Mulders, MD 06/26/23 1114

## 2023-06-23 NOTE — Telephone Encounter (Signed)
  Chief Complaint: Fever, Fatigue Symptoms: Back pain after urination, fever, fatigue, Chest pain Frequency: Today Disposition: [x] ED /[] Urgent Care (no appt availability in office) / [] Appointment(In office/virtual)/ []  [facility]/ [] Home Care/ [] Refused Recommended Disposition /[] [place] [facility]/ []  Follow-up with PCP Additional Notes: Patient calling in with c/o back pain after urination, fever of 100.7, HR 145, chest pain and fatigue. Does have a hx of recurrent UTIs. Patient last took tylenol last night. Patient sounds ill over the phone. Per Protocol, patient needs to be seen at the ED. Patient states her mother is coming to get her and will take her to the ED. Patient verbalizes understanding and importance of being seen today. All questions answered.  Copied from CRM [id number]. Topic: Clinical - Red Word Triage >> Jun 23, 2023  1:16 PM Chantha C wrote: Red Word that prompted transfer to Nurse Triage: Yesterday intense lower back pain esp when urinating and after, fever 101.7, heart rate 145, feeling winded, short stabbing chest pain when breathing in and out, fatigue. Pt cannot leave work until to be seen and will go to urgent care today because there's no availability. While speaking, pt had a difficult time. Reason for Disposition  Patient sounds very sick or weak to the triager  (Exception: Mild weakness and hasn't taken fever medicine.)  Answer Assessment - Initial Assessment Questions 1. TEMPERATURE: "What is the most recent temperature?"  "How was it measured?"      100.7 oral 2. ONSET: "When did the fever start?"      Last temperature check at 1220 3. CHILLS: "Do you have chills?" If yes: "How bad are they?"  (e.g., none, mild, moderate, severe)   - NONE: no chills   - MILD: feeling cold   - MODERATE: feeling very cold, some shivering (feels better under a thick blanket)   - SEVERE: feeling extremely cold with shaking chills (general body shaking, rigors; even  under a thick blanket)      Moderate 4. OTHER SYMPTOMS: "Do you have any other symptoms besides the fever?"  (e.g., abdomen pain, cough, diarrhea, earache, headache, sore throat, urination pain)     Pain after urination, high HR 5. CAUSE: If there are no symptoms, ask: "What do you think is causing the fever?"      unsure 6. CONTACTS: "Does anyone else in the family have an infection?"     No 7. TREATMENT: "What have you done so far to treat this fever?" (e.g., medications)     Took Tylenol last night 8. IMMUNOCOMPROMISE: "Do you have of the following: diabetes, HIV positive, splenectomy, cancer chemotherapy, chronic steroid treatment, transplant patient, etc."     No  Protocols used: Fever-A-AH

## 2023-06-23 NOTE — ED Triage Notes (Signed)
Pt reports lower back pain that hurts more when she moves and urinates.  Pt reports she had given her dogs a bath and had been moving things to get things ready at work.

## 2023-07-18 ENCOUNTER — Other Ambulatory Visit: Payer: Self-pay | Admitting: Internal Medicine

## 2023-07-18 DIAGNOSIS — F411 Generalized anxiety disorder: Secondary | ICD-10-CM

## 2023-07-31 ENCOUNTER — Ambulatory Visit (INDEPENDENT_AMBULATORY_CARE_PROVIDER_SITE_OTHER): Payer: 59 | Admitting: Internal Medicine

## 2023-07-31 ENCOUNTER — Encounter: Payer: Self-pay | Admitting: Internal Medicine

## 2023-07-31 VITALS — BP 142/76 | HR 107 | Ht 70.5 in | Wt 195.0 lb

## 2023-07-31 DIAGNOSIS — K582 Mixed irritable bowel syndrome: Secondary | ICD-10-CM

## 2023-07-31 DIAGNOSIS — I10 Essential (primary) hypertension: Secondary | ICD-10-CM | POA: Insufficient documentation

## 2023-07-31 DIAGNOSIS — F411 Generalized anxiety disorder: Secondary | ICD-10-CM

## 2023-07-31 MED ORDER — AMLODIPINE BESYLATE 5 MG PO TABS: 5.0000 mg | ORAL_TABLET | Freq: Every day | ORAL | 0 refills | Status: DC

## 2023-07-31 NOTE — Assessment & Plan Note (Addendum)
BP Readings from Last 1 Encounters:  07/31/23 (!) 150/80   New onset Started Amlodipine 5 mg QD Counseled for compliance with the medications Advised DASH diet and moderate exercise/walking, at least 150 mins/week

## 2023-07-31 NOTE — Patient Instructions (Signed)
Please start Amlodipine as prescribed.  Please follow DASH diet and perform moderate exercise/walking at least 150 mins/week.

## 2023-07-31 NOTE — Assessment & Plan Note (Signed)
Better controlled with Effexor 37.5 mg once daily Had to stop Wellbutrin due to constipation Has episodes of anxiety at times based on situation, has Vistaril PRN Was uncontrolled with Lexapro Has tried Zoloft, had GI side effects with it

## 2023-07-31 NOTE — Progress Notes (Signed)
Established Patient Office Visit  Subjective:  Patient ID: Kim [name], female    DOB: March 08, 1996  Age: 28 y.o. MRN: [id number]  CC:  Chief Complaint  Patient presents with   Hypertension    Has concerns about her bp, it has been elevated, and has noticed headaches. Has noticed her vision blurry at times. Bp was 149/101 this morning.     HPI Kim [name] is a 28 y.o. female with past medical history of migraine, GAD and chronic diarrhea who presents for f/u of her chronic medical conditions.  HTN: Her BP has been elevated lately at home and workplace.  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  She has noted generalized headache and visual disturbance at times.  She has intermittent palpitations, likely related to anxiety. Her mother had HTN is her 58s.  GAD: She is feeling better with the Effexor.  She has hydroxyzine as needed for panic episodes/severe anxiety.  She has tried Wellbutrin, Zoloft, Lexapro and BuSpar, but did not help much.  She has history of panic episode in the past.  Denies any recent panic episode.  Denies any SI or HI currently.  IBS: She has history of chronic diarrhea from IBS.  She takes hyoscyamine as needed for diarrhea and abdominal cramping.  Of note, she has been having constipation recently.  Denies any melena or hematochezia.  She has tried Dulcolax in the past.  She did take MiraLAX as needed for constipation, which has helped her.    Past Medical History:  Diagnosis Date   ADHD (attention deficit hyperactivity disorder)    Anxiety    Depression    Encounter for gynecological examination with Papanicolaou smear of cervix 03/27/2017   Encounter for surveillance of contraceptive pills 03/27/2017   GERD (gastroesophageal reflux disease)    Migraines     Past Surgical History:  Procedure Laterality Date   BIOPSY  01/06/2021   Procedure: BIOPSY;  Surgeon: Malissa Hippo, MD;  Location: [facility];  Service: Endoscopy;;   COLONOSCOPY WITH PROPOFOL N/A 01/06/2021   Procedure: COLONOSCOPY WITH PROPOFOL;  Surgeon: Malissa Hippo, MD;  Location: [facility];  Service: Endoscopy;  Laterality: N/A;  1:20   ESOPHAGOGASTRODUODENOSCOPY (EGD) WITH PROPOFOL N/A 01/06/2021   Procedure: ESOPHAGOGASTRODUODENOSCOPY (EGD) WITH PROPOFOL;  Surgeon: Malissa Hippo, MD;  Location: [facility];  Service: Endoscopy;  Laterality: N/A;    Family History  Problem Relation Age of Onset   Cancer Paternal Grandfather    Alzheimer's disease Paternal Grandmother    Cancer Maternal Grandmother        breast    Cancer Maternal Grandfather        pancreatic and bone   Cancer Father        lung   Hypertension Mother     Social History   Socioeconomic History   Marital status: Single    Spouse name: Not on file   Number of children: Not on file   Years of education: Not on file   Highest education level: Associate degree: occupational, Scientist, product/process development, or vocational program  Occupational History   Not on file  Tobacco Use   Smoking status: Never   Smokeless tobacco: Never  Vaping Use   Vaping status: Never Used  Substance and Sexual Activity   Alcohol use: Not Currently    Comment: monthly   Drug use: No   Sexual activity:  Yes    Birth control/protection: I.U.D.  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     hydrOXYzine (VISTARIL) 25 MG capsule Take 1 capsule (25 mg total) by mouth every 8 (eight) hours as needed for anxiety. 30 capsule 2   hyoscyamine (LEVBID) 0.375 MG 12 hr tablet Take 1 tablet (0.375 mg total) by mouth every 12 (twelve) hours as needed for cramping. 30 tablet 2   ibuprofen (ADVIL) 100 MG/5ML suspension Take 30 mLs (600 mg total) by mouth every 8 (eight) hours as needed for moderate pain or fever. 500 mL 1   SUMAtriptan (IMITREX) 100 MG tablet Take 1 tablet (100 mg total) by mouth every 2 (two) hours as needed for migraine. May repeat in 2 hours if headache persists or recurs. 10 tablet 2   venlafaxine XR (EFFEXOR-XR) 37.5 MG 24 hr capsule TAKE 1 CAPSULE BY MOUTH DAILY WITH BREAKFAST. 30 capsule 2   No facility-administered medications prior to visit.    Allergies  Allergen Reactions   Amoxicillin Rash   Penicillins Rash   Doxycycline Hives   Ketorolac Hives   Rocephin [Ceftriaxone] Hives   Dicyclomine Rash   Latex Itching    ROS Review of Systems  Constitutional:  Positive for fatigue. Negative for chills and fever.  HENT:  Negative for congestion, sinus pressure, sinus pain and sore throat.   Eyes:  Negative for pain and discharge.  Respiratory:  Negative for cough and shortness of breath.   Cardiovascular:  Negative for chest pain and palpitations.  Gastrointestinal:  Positive for constipation. Negative for nausea and vomiting.  Endocrine: Negative for polydipsia and polyuria.  Genitourinary:  Negative for dysuria and hematuria.  Musculoskeletal:  Negative for neck pain and neck stiffness.  Skin:  Negative for rash.  Neurological:  Positive for headaches. Negative  for dizziness and weakness.  Psychiatric/Behavioral:  Negative for agitation and behavioral problems. The patient is nervous/anxious.       Objective:    Physical Exam Vitals reviewed.  Constitutional:      General: She is not in acute distress.    Appearance: She is not diaphoretic.  HENT:     Head: Normocephalic and atraumatic.     Nose: Nose normal. No congestion.     Mouth/Throat:     Mouth: Mucous membranes are moist.     Pharynx: No posterior oropharyngeal erythema.  Eyes:     General: No scleral icterus.  Extraocular Movements: Extraocular movements intact.  Cardiovascular:     Rate and Rhythm: Normal rate and regular rhythm.     Pulses: Normal pulses.     Heart sounds: Normal heart sounds. No murmur heard. Pulmonary:     Breath sounds: Normal breath sounds. No wheezing or rales.  Musculoskeletal:     Cervical back: Neck supple. No tenderness.     Right lower leg: No edema.     Left lower leg: No edema.  Skin:    General: Skin is warm.     Findings: No rash.  Neurological:     General: No focal deficit present.     Mental Status: She is alert and oriented to person, place, and time.     Sensory: No sensory deficit.     Motor: Tremor present. No weakness.  Psychiatric:        Mood and Affect: Mood normal.        Behavior: Behavior normal.     BP (!) 142/76 (BP Location: Left Arm)   Pulse (!) 107   Ht 5' 10.5" (1.791 m)   Wt 195 lb (88.5 kg)   SpO2 100%   BMI 27.58 kg/m  Wt Readings from Last 3 Encounters:  07/31/23 195 lb (88.5 kg)  06/23/23 185 lb (83.9 kg)  03/24/23 192 lb 12.8 oz (87.5 kg)    Lab Results  Component Value Date   TSH 1.730 03/07/2022   Lab Results  Component Value Date   WBC 7.9 03/24/2023   HGB 13.5 03/24/2023   HCT 40.4 03/24/2023   MCV 90 03/24/2023   PLT 229 03/24/2023   Lab Results  Component Value Date   NA 141 03/24/2023   K 4.6 03/24/2023   CO2 21 03/24/2023   GLUCOSE 91 03/24/2023   BUN 10 03/24/2023    CREATININE 0.72 03/24/2023   BILITOT 0.3 03/24/2023   ALKPHOS 67 03/24/2023   AST 13 03/24/2023   ALT 10 03/24/2023   PROT 7.5 03/24/2023   ALBUMIN 4.5 03/24/2023   CALCIUM 9.5 03/24/2023   ANIONGAP 11 03/26/2021   EGFR 117 03/24/2023   Lab Results  Component Value Date   CHOL 143 03/07/2022   Lab Results  Component Value Date   HDL 45 03/07/2022   Lab Results  Component Value Date   LDLCALC 85 03/07/2022   Lab Results  Component Value Date   TRIG 61 03/07/2022   Lab Results  Component Value Date   CHOLHDL 3.2 03/07/2022   Lab Results  Component Value Date   HGBA1C 4.9 03/07/2022      Assessment & Plan:   Problem List Items Addressed This Visit       Cardiovascular and Mediastinum   Essential hypertension - Primary   BP Readings from Last 1 Encounters:  07/31/23 (!) 150/80   New onset Started Amlodipine 5 mg QD Counseled for compliance with the medications Advised DASH diet and moderate exercise/walking, at least 150 mins/week       Relevant Medications   amLODipine (NORVASC) 5 MG tablet     Digestive   Irritable bowel syndrome with both constipation and diarrhea   Takes Hyoscyamine PRN for abdominal cramping Has episodes of constipation, advised to take MiraLAX as needed and to avoid hyoscyamine when she has constipation        Other   GAD (generalized anxiety disorder)   Better controlled with Effexor 37.5 mg once daily Had to stop Wellbutrin due to constipation Has episodes of anxiety  at times based on situation, has Vistaril PRN Was uncontrolled with Lexapro Has tried Zoloft, had GI side effects with it         Meds ordered this encounter  Medications   amLODipine (NORVASC) 5 MG tablet    Sig: Take 1 tablet (5 mg total) by mouth daily.    Dispense:  90 tablet    Refill:  0    Follow-up: Return in about 6 weeks (around 09/11/2023).    Anabel Halon, MD

## 2023-07-31 NOTE — Assessment & Plan Note (Addendum)
Takes Hyoscyamine PRN for abdominal cramping Has episodes of constipation, advised to take MiraLAX as needed and to avoid hyoscyamine when she has constipation

## 2023-08-12 ENCOUNTER — Other Ambulatory Visit: Payer: Self-pay | Admitting: Internal Medicine

## 2023-08-12 DIAGNOSIS — F411 Generalized anxiety disorder: Secondary | ICD-10-CM

## 2023-08-17 ENCOUNTER — Other Ambulatory Visit: Payer: Self-pay | Admitting: Internal Medicine

## 2023-08-17 DIAGNOSIS — G43009 Migraine without aura, not intractable, without status migrainosus: Secondary | ICD-10-CM

## 2023-08-22 ENCOUNTER — Other Ambulatory Visit: Payer: Self-pay | Admitting: Internal Medicine

## 2023-08-22 DIAGNOSIS — I10 Essential (primary) hypertension: Secondary | ICD-10-CM

## 2023-09-06 ENCOUNTER — Encounter: Payer: Self-pay | Admitting: Internal Medicine

## 2023-09-06 ENCOUNTER — Ambulatory Visit (INDEPENDENT_AMBULATORY_CARE_PROVIDER_SITE_OTHER): Payer: 59 | Admitting: Internal Medicine

## 2023-09-06 VITALS — BP 138/86 | HR 108 | Ht 70.5 in | Wt 199.0 lb

## 2023-09-06 DIAGNOSIS — I10 Essential (primary) hypertension: Secondary | ICD-10-CM | POA: Diagnosis not present

## 2023-09-06 DIAGNOSIS — F411 Generalized anxiety disorder: Secondary | ICD-10-CM | POA: Diagnosis not present

## 2023-09-06 DIAGNOSIS — E785 Hyperlipidemia, unspecified: Secondary | ICD-10-CM | POA: Diagnosis not present

## 2023-09-06 DIAGNOSIS — Z0001 Encounter for general adult medical examination with abnormal findings: Secondary | ICD-10-CM

## 2023-09-06 DIAGNOSIS — K582 Mixed irritable bowel syndrome: Secondary | ICD-10-CM

## 2023-09-06 DIAGNOSIS — G43009 Migraine without aura, not intractable, without status migrainosus: Secondary | ICD-10-CM

## 2023-09-06 DIAGNOSIS — E559 Vitamin D deficiency, unspecified: Secondary | ICD-10-CM

## 2023-09-06 MED ORDER — VENLAFAXINE HCL ER 75 MG PO CP24: 75.0000 mg | ORAL_CAPSULE | Freq: Every day | ORAL | 5 refills | Status: DC

## 2023-09-06 NOTE — Assessment & Plan Note (Signed)
 Takes Hyoscyamine PRN for abdominal cramping Has episodes of constipation, advised to take MiraLAX as needed and to avoid hyoscyamine when she has constipation

## 2023-09-06 NOTE — Assessment & Plan Note (Signed)
 Has intermittent episodic episodes of migraine, but once every 2-3 months Maxalt was ineffective Has sumatriptan as needed for now

## 2023-09-06 NOTE — Patient Instructions (Signed)
 Please start taking Effexor 75 mg once daily instead of 37.5 mg.  Please continue to take medications as prescribed.  Please continue to follow low salt diet and perform moderate exercise/walking at least 150 mins/week.

## 2023-09-06 NOTE — Assessment & Plan Note (Addendum)
 BP Readings from Last 1 Encounters:  09/06/23 138/86   Well-controlled now Recently started Amlodipine 5 mg once daily Has tachycardia, likely due to situational anxiety Counseled for compliance with the medications Advised DASH diet and moderate exercise/walking, at least 150 mins/week

## 2023-09-06 NOTE — Assessment & Plan Note (Addendum)
 Better controlled with Effexor 37.5 mg once daily Considering inattention (ADHD) symptoms, would increase dose of Effexor to 75 mg QD Had to stop Wellbutrin due to constipation Has episodes of anxiety at times based on situation, has Vistaril PRN Has tried Wellbutrin, Zoloft, Lexapro, Cymbalta and BuSpar

## 2023-09-06 NOTE — Assessment & Plan Note (Signed)
 Physical exam as documented. Fasting blood tests ordered today. Denies flu vaccine.

## 2023-09-06 NOTE — Progress Notes (Signed)
 Established Patient Office Visit  Subjective:  Patient ID: Kim [name], female    DOB: [birth year]  Age: 28 y.o. MRN: [id number]  CC:  Chief Complaint  Patient presents with   Anxiety    Six month follow up     HPI Kim [name] is a 28 y.o. female with past medical history of migraine, GAD and chronic diarrhea who presents for f/u of her chronic medical conditions/annual physical.  HTN: Her BP has been better controlled with amlodipine now at home and workplace.  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 She has intermittent palpitations, likely related to anxiety. Her HR remains in 100s during office visits. Her mother had HTN is her 39s.  GAD: She is feeling better with the Effexor compared to prior.  She has episodes of difficulty concentrating.  She was diagnosed with ADHD in her early 84s, was given Adderall, but did not tolerate it. She has hydroxyzine as needed for panic episodes/severe anxiety.  She has tried Wellbutrin, Zoloft, Lexapro, Cymbalta and BuSpar, but did not help much.  She has history of panic episode in the past.  Denies any recent panic episode.  Denies any SI or HI currently.  IBS: She has history of chronic diarrhea from IBS.  She takes hyoscyamine as needed for diarrhea and abdominal cramping.  Of note, she has been having constipation recently.  Denies any melena or hematochezia.  She has tried Dulcolax in the past.  She did take MiraLAX as needed for constipation, which has helped her.    Past Medical History:  Diagnosis Date   ADHD (attention deficit hyperactivity disorder)    Anxiety    Depression    Encounter for gynecological examination with Papanicolaou smear of cervix 03/27/2017   Encounter for surveillance of contraceptive pills 03/27/2017   GERD (gastroesophageal reflux disease)    Migraines     Past Surgical History:  Procedure Laterality Date   BIOPSY  01/06/2021   Procedure: BIOPSY;  Surgeon: Malissa Hippo, MD;  Location: [facility];  Service: Endoscopy;;   COLONOSCOPY WITH PROPOFOL N/A 01/06/2021   Procedure: COLONOSCOPY WITH PROPOFOL;  Surgeon: Malissa Hippo, MD;  Location: [facility];  Service: Endoscopy;  Laterality: N/A;  1:20   ESOPHAGOGASTRODUODENOSCOPY (EGD) WITH PROPOFOL N/A 01/06/2021   Procedure: ESOPHAGOGASTRODUODENOSCOPY (EGD) WITH PROPOFOL;  Surgeon: Malissa Hippo, MD;  Location: [facility];  Service: Endoscopy;  Laterality: N/A;    Family History  Problem Relation Age of Onset   Cancer Paternal Grandfather    Alzheimer's disease Paternal Grandmother    Cancer Maternal Grandmother        breast    Cancer Maternal Grandfather        pancreatic and bone   Cancer Father        lung   Hypertension Mother     Social History   Socioeconomic History   Marital status: Single    Spouse name: Not on file   Number of children: Not on file   Years of education: Not on file   Highest education level: Associate degree: occupational, Scientist, product/process development, or vocational program  Occupational History   Not on file  Tobacco Use   Smoking status: Never   Smokeless tobacco: Never  Vaping Use   Vaping status: Never Used  Substance and Sexual Activity   Alcohol use: Not Currently    Comment: monthly   Drug use: No   Sexual activity: Yes  Birth control/protection: I.U.D.  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     amLODipine (NORVASC) 5 MG tablet TAKE 1 TABLET (5 MG TOTAL) BY MOUTH DAILY. 90 tablet 1   hydrOXYzine (VISTARIL) 25 MG capsule Take 1 capsule (25 mg total) by mouth every 8 (eight) hours as needed for anxiety. 30 capsule 2   hyoscyamine (LEVBID) 0.375 MG 12 hr tablet Take 1 tablet (0.375 mg total) by mouth every 12 (twelve) hours as needed for cramping. 30 tablet 2   ibuprofen (ADVIL) 100 MG/5ML suspension Take 30 mLs (600 mg total) by mouth every 8 (eight) hours as needed for moderate pain or fever. 500 mL 1   SUMAtriptan (IMITREX) 100 MG tablet TAKE 1 TAB BY MOUTH EVERY 2 HOURS AS NEEDED FOR MIGRAINE. MAY REPEAT IN 2 HOURS IF HEADACHE PERSISTS OR RECURS. 10 tablet 2   venlafaxine XR (EFFEXOR-XR) 37.5 MG 24 hr capsule TAKE 1 CAPSULE BY MOUTH DAILY WITH BREAKFAST. 90 capsule 1   No facility-administered medications prior to visit.    Allergies  Allergen Reactions   Amoxicillin Rash   Penicillins Rash   Doxycycline Hives   Ketorolac Hives   Rocephin [Ceftriaxone] Hives   Dicyclomine Rash   Latex Itching    ROS Review of Systems  Constitutional:  Positive for fatigue. Negative for chills and fever.  HENT:  Negative for congestion, sinus pressure, sinus pain and sore throat.   Eyes:  Negative for pain and discharge.  Respiratory:  Negative for cough and shortness of breath.   Cardiovascular:  Negative for chest pain and palpitations.  Gastrointestinal:  Positive for constipation. Negative for nausea and vomiting.  Endocrine: Negative for polydipsia and polyuria.  Genitourinary:  Negative for dysuria and hematuria.  Musculoskeletal:  Negative for neck pain and neck stiffness.  Skin:   Negative for rash.  Neurological:  Positive for headaches. Negative for dizziness and weakness.  Psychiatric/Behavioral:  Negative for agitation and behavioral problems. The patient is nervous/anxious.       Objective:    Physical Exam Vitals reviewed.  Constitutional:      General: She is not in acute distress.    Appearance: She is not diaphoretic.  HENT:     Head: Normocephalic and atraumatic.     Nose: Nose normal. No congestion.     Mouth/Throat:     Mouth: Mucous membranes are moist.     Pharynx: No posterior oropharyngeal erythema.  Eyes:     General: No scleral icterus.    Extraocular Movements: Extraocular movements intact.  Cardiovascular:     Rate and Rhythm: Normal rate and regular rhythm.     Pulses: Normal pulses.     Heart sounds: Normal heart sounds. No murmur heard. Pulmonary:     Breath sounds: Normal breath sounds. No wheezing or rales.  Abdominal:     Palpations: Abdomen is soft.     Tenderness: There is no abdominal tenderness.  Musculoskeletal:     Cervical back: Neck supple. No tenderness.     Right lower leg: No edema.     Left lower leg: No edema.  Skin:    General: Skin is warm.     Findings: No rash.  Neurological:     General: No focal deficit present.     Mental Status: She is alert and oriented to person, place, and time.     Cranial Nerves: No cranial nerve deficit.     Sensory: No sensory deficit.     Motor: Tremor present. No weakness.  Psychiatric:        Mood and Affect: Mood normal.        Behavior: Behavior normal.     BP 138/86 (BP Location: Left Arm, Patient Position: Sitting)   Pulse (!) 108   Ht 5' 10.5" (1.791 m)   Wt 199 lb (90.3 kg)   SpO2 98%   BMI 28.15 kg/m  Wt Readings from Last 3 Encounters:  09/06/23 199 lb (90.3 kg)  07/31/23 195 lb (88.5 kg)  06/23/23 185 lb (83.9 kg)    Lab Results  Component Value Date   TSH 1.730 03/07/2022   Lab Results  Component Value Date   WBC 7.9 03/24/2023   HGB 13.5  03/24/2023   HCT 40.4 03/24/2023   MCV 90 03/24/2023   PLT 229 03/24/2023   Lab Results  Component Value Date   NA 141 03/24/2023   K 4.6 03/24/2023   CO2 21 03/24/2023   GLUCOSE 91 03/24/2023   BUN 10 03/24/2023   CREATININE 0.72 03/24/2023   BILITOT 0.3 03/24/2023   ALKPHOS 67 03/24/2023   AST 13 03/24/2023   ALT 10 03/24/2023   PROT 7.5 03/24/2023   ALBUMIN 4.5 03/24/2023   CALCIUM 9.5 03/24/2023   ANIONGAP 11 03/26/2021   EGFR 117 03/24/2023   Lab Results  Component Value Date   CHOL 143 03/07/2022   Lab Results  Component Value Date   HDL 45 03/07/2022   Lab Results  Component Value Date   LDLCALC 85 03/07/2022   Lab Results  Component Value Date   TRIG 61 03/07/2022   Lab Results  Component Value Date   CHOLHDL 3.2 03/07/2022   Lab Results  Component Value Date   HGBA1C 4.9 03/07/2022      Assessment & Plan:   Problem List Items Addressed This Visit       Cardiovascular and Mediastinum   Migraine   Has intermittent episodic episodes of migraine, but once every 2-3 months Maxalt was ineffective Has sumatriptan as needed for now      Relevant Medications   venlafaxine XR (EFFEXOR-XR) 75 MG 24 hr capsule   Essential hypertension - Primary   BP Readings from Last 1 Encounters:  09/06/23 138/86   Well-controlled now Recently started Amlodipine 5 mg once daily Has tachycardia, likely due to situational anxiety Counseled for compliance with the medications Advised DASH diet and moderate exercise/walking, at least 150  mins/week       Relevant Orders   TSH   CMP14+EGFR   CBC with Differential/Platelet     Digestive   Irritable bowel syndrome with both constipation and diarrhea   Takes Hyoscyamine PRN for abdominal cramping Has episodes of constipation, advised to take MiraLAX as needed and to avoid hyoscyamine when she has constipation        Other   Encounter for general adult medical examination with abnormal findings   Physical  exam as documented. Fasting blood tests ordered today. Denies flu vaccine.      GAD (generalized anxiety disorder)   Better controlled with Effexor 37.5 mg once daily Considering inattention (ADHD) symptoms, would increase dose of Effexor to 75 mg QD Had to stop Wellbutrin due to constipation Has episodes of anxiety at times based on situation, has Vistaril PRN Has tried Wellbutrin, Zoloft, Lexapro, Cymbalta and BuSpar      Relevant Medications   venlafaxine XR (EFFEXOR-XR) 75 MG 24 hr capsule   Other Relevant Orders   TSH   Vitamin D deficiency   Last vitamin D Lab Results  Component Value Date   VD25OH 21.9 (L) 03/07/2022   Continue vitamin D 5000 IU QD      Relevant Orders   VITAMIN D 25 Hydroxy (Vit-D Deficiency, Fractures)   Other Visit Diagnoses       Hyperlipidemia, unspecified hyperlipidemia type       Relevant Orders   Lipid panel          Meds ordered this encounter  Medications   venlafaxine XR (EFFEXOR-XR) 75 MG 24 hr capsule    Sig: Take 1 capsule (75 mg total) by mouth daily with breakfast.    Dispense:  30 capsule    Refill:  5    Follow-up: Return in about 6 months (around 03/05/2024) for HTN and GAD.    Anabel Halon, MD

## 2023-09-06 NOTE — Assessment & Plan Note (Signed)
 Last vitamin D Lab Results  Component Value Date   VD25OH 21.9 (L) 03/07/2022   Continue vitamin D 5000 IU QD

## 2023-09-11 ENCOUNTER — Ambulatory Visit: Payer: 59 | Admitting: Internal Medicine

## 2023-09-22 ENCOUNTER — Ambulatory Visit: Payer: Self-pay | Admitting: Internal Medicine

## 2023-09-28 ENCOUNTER — Other Ambulatory Visit: Payer: Self-pay | Admitting: Internal Medicine

## 2023-09-28 DIAGNOSIS — K582 Mixed irritable bowel syndrome: Secondary | ICD-10-CM

## 2023-09-30 ENCOUNTER — Other Ambulatory Visit: Payer: Self-pay | Admitting: Internal Medicine

## 2023-09-30 ENCOUNTER — Other Ambulatory Visit: Payer: Self-pay

## 2023-09-30 ENCOUNTER — Encounter (HOSPITAL_COMMUNITY): Payer: Self-pay | Admitting: Emergency Medicine

## 2023-09-30 ENCOUNTER — Emergency Department (HOSPITAL_COMMUNITY)
Admission: EM | Admit: 2023-09-30 | Discharge: 2023-10-01 | Disposition: A | Discharge status: Home / Self Care | Attending: Emergency Medicine | Admitting: Emergency Medicine

## 2023-09-30 ENCOUNTER — Emergency Department (HOSPITAL_COMMUNITY)

## 2023-09-30 DIAGNOSIS — Z91040 Latex allergy status: Secondary | ICD-10-CM | POA: Diagnosis not present

## 2023-09-30 DIAGNOSIS — R791 Abnormal coagulation profile: Secondary | ICD-10-CM | POA: Diagnosis not present

## 2023-09-30 DIAGNOSIS — R519 Headache, unspecified: Secondary | ICD-10-CM | POA: Diagnosis not present

## 2023-09-30 DIAGNOSIS — R202 Paresthesia of skin: Secondary | ICD-10-CM | POA: Diagnosis not present

## 2023-09-30 DIAGNOSIS — R4781 Slurred speech: Secondary | ICD-10-CM | POA: Diagnosis not present

## 2023-09-30 DIAGNOSIS — G43809 Other migraine, not intractable, without status migrainosus: Secondary | ICD-10-CM | POA: Diagnosis not present

## 2023-09-30 DIAGNOSIS — R471 Dysarthria and anarthria: Secondary | ICD-10-CM | POA: Insufficient documentation

## 2023-09-30 DIAGNOSIS — R29818 Other symptoms and signs involving the nervous system: Secondary | ICD-10-CM | POA: Diagnosis not present

## 2023-09-30 DIAGNOSIS — G43109 Migraine with aura, not intractable, without status migrainosus: Secondary | ICD-10-CM

## 2023-09-30 DIAGNOSIS — F411 Generalized anxiety disorder: Secondary | ICD-10-CM

## 2023-09-30 LAB — I-STAT CHEM 8, ED
BUN: 6 mg/dL (ref 6–20)
Calcium, Ion: 1.11 mmol/L — ABNORMAL LOW (ref 1.15–1.40)
Chloride: 104 mmol/L (ref 98–111)
Creatinine, Ser: 0.6 mg/dL (ref 0.44–1.00)
Glucose, Bld: 106 mg/dL — ABNORMAL HIGH (ref 70–99)
HCT: 38 % (ref 36.0–46.0)
Hemoglobin: 12.9 g/dL (ref 12.0–15.0)
Potassium: 3.4 mmol/L — ABNORMAL LOW (ref 3.5–5.1)
Sodium: 141 mmol/L (ref 135–145)
TCO2: 21 mmol/L — ABNORMAL LOW (ref 22–32)

## 2023-09-30 LAB — CBG MONITORING, ED: Glucose-Capillary: 111 mg/dL — ABNORMAL HIGH (ref 70–99)

## 2023-09-30 MED ORDER — DIPHENHYDRAMINE HCL 50 MG/ML IJ SOLN
25.0000 mg | Freq: Once | INTRAMUSCULAR | Status: AC
  Administered 2023-09-30: 25 mg via INTRAVENOUS
  Filled 2023-09-30: qty 1

## 2023-09-30 MED ORDER — PROCHLORPERAZINE EDISYLATE 10 MG/2ML IJ SOLN
10.0000 mg | Freq: Once | INTRAMUSCULAR | Status: AC
  Administered 2023-09-30: 10 mg via INTRAVENOUS
  Filled 2023-09-30: qty 2

## 2023-09-30 NOTE — ED Triage Notes (Signed)
 Pt here with c/o migraine. Per mother, pt had a migraine and then developed slurred speech and L sided numbness. Mother states pt has hx of anxiety and a "very stressful week". Pt tried to take her migraine medicine but vomited it up per family.

## 2023-10-01 DIAGNOSIS — R29818 Other symptoms and signs involving the nervous system: Secondary | ICD-10-CM | POA: Diagnosis not present

## 2023-10-01 LAB — DIFFERENTIAL
Abs Immature Granulocytes: 0.02 10*3/uL (ref 0.00–0.07)
Basophils Absolute: 0.1 10*3/uL (ref 0.0–0.1)
Basophils Relative: 1 %
Eosinophils Absolute: 0.1 10*3/uL (ref 0.0–0.5)
Eosinophils Relative: 1 %
Immature Granulocytes: 0 %
Lymphocytes Relative: 42 %
Lymphs Abs: 3.2 10*3/uL (ref 0.7–4.0)
Monocytes Absolute: 0.6 10*3/uL (ref 0.1–1.0)
Monocytes Relative: 8 %
Neutro Abs: 3.7 10*3/uL (ref 1.7–7.7)
Neutrophils Relative %: 48 %

## 2023-10-01 LAB — CBC
HCT: 37.4 % (ref 36.0–46.0)
Hemoglobin: 12.4 g/dL (ref 12.0–15.0)
MCH: 29.3 pg (ref 26.0–34.0)
MCHC: 33.2 g/dL (ref 30.0–36.0)
MCV: 88.4 fL (ref 80.0–100.0)
Platelets: 257 10*3/uL (ref 150–400)
RBC: 4.23 MIL/uL (ref 3.87–5.11)
RDW: 12.4 % (ref 11.5–15.5)
WBC: 7.7 10*3/uL (ref 4.0–10.5)
nRBC: 0 % (ref 0.0–0.2)

## 2023-10-01 LAB — COMPREHENSIVE METABOLIC PANEL
ALT: 15 U/L (ref 0–44)
AST: 16 U/L (ref 15–41)
Albumin: 3.9 g/dL (ref 3.5–5.0)
Alkaline Phosphatase: 47 U/L (ref 38–126)
Anion gap: 11 (ref 5–15)
BUN: 7 mg/dL (ref 6–20)
CO2: 21 mmol/L — ABNORMAL LOW (ref 22–32)
Calcium: 8.9 mg/dL (ref 8.9–10.3)
Chloride: 106 mmol/L (ref 98–111)
Creatinine, Ser: 0.65 mg/dL (ref 0.44–1.00)
GFR, Estimated: >60 mL/min (ref >60)
Glucose, Bld: 111 mg/dL — ABNORMAL HIGH (ref 70–99)
Potassium: 3.5 mmol/L (ref 3.5–5.1)
Sodium: 138 mmol/L (ref 135–145)
Total Bilirubin: 0.4 mg/dL (ref 0.0–1.2)
Total Protein: 7.4 g/dL (ref 6.5–8.1)

## 2023-10-01 LAB — ETHANOL: Alcohol, Ethyl (B): <10 mg/dL (ref <10)

## 2023-10-01 LAB — PROTIME-INR
INR: 1 (ref 0.8–1.2)
Prothrombin Time: 13.6 s (ref 11.4–15.2)

## 2023-10-01 LAB — APTT: aPTT: 27 s (ref 24–36)

## 2023-10-01 MED ORDER — LORAZEPAM 2 MG/ML IJ SOLN
1.0000 mg | Freq: Once | INTRAMUSCULAR | Status: AC
  Administered 2023-10-01: 1 mg via INTRAVENOUS
  Filled 2023-10-01: qty 1

## 2023-10-01 NOTE — ED Notes (Signed)
 Per neurologist, symptoms most likely related to hx of migraines, no thrombolytic ordered, recommends MRI, will contact EDP with recommendation

## 2023-10-01 NOTE — ED Notes (Signed)
Pt ambulated to BR, steady gait noted 

## 2023-10-01 NOTE — Discharge Instructions (Signed)
 You were seen today for headache and neurologic symptoms.  Your workup today is reassuring.  MRI was recommended; however you opted to have this done as an outpatient.  Neurology referral was placed.  It is very important that you follow-up and have MRI as directed.  Continue your triptan at home for ongoing migraine symptoms.  Return for any new or worsening symptoms.

## 2023-10-01 NOTE — ED Notes (Signed)
 Pt and Mother report hair extensions have to be removed professionally and prefer to have them removed then have an outpatient MRI

## 2023-10-01 NOTE — ED Provider Notes (Signed)
 [place] EMERGENCY DEPARTMENT AT [facility] Provider Note   CSN: [id number] Arrival date & time: 09/30/23  2330  An emergency department physician performed an initial assessment on this suspected stroke patient at 2349.  History  Chief Complaint  Patient presents with   Migraine   Aphasia   Code Stroke    Kim [name] is a 28 y.o. female.  HPI     This is a 28 year old female who presents with her mother with concerns for headache, vomiting, slurred speech.  Last seen normal around 9:30 PM when she went to bed.  She woke up just prior to arrival with a headache and left-sided numbness with slurred speech.  Reports that headache is left-sided which corresponds with the side of her symptoms.  Patient tried to take her triptan but vomited it up.  Denies worst headache of her life.  Per the patient's mother, she has significant anxiety which was worsened this week because of stresses at work.  Patient had a similar presentation many years ago and had a full stroke workup at the time because of focal neurologic deficits.  Has not taken her anxiety or blood pressure medications in several days.  Home Medications Prior to Admission medications   Medication Sig Start Date End Date Taking? Authorizing Provider  acetaminophen (TYLENOL) 500 MG tablet Take 500-1,000 mg by mouth every 6 (six) hours as needed for moderate pain or headache.    [provider]  amLODipine (NORVASC) 5 MG tablet TAKE 1 TABLET (5 MG TOTAL) BY MOUTH DAILY. 08/22/23   Anabel Halon, MD  hydrOXYzine (VISTARIL) 25 MG capsule Take 1 capsule (25 mg total) by mouth every 8 (eight) hours as needed for anxiety. 03/24/23   Anabel Halon, MD  hyoscyamine (LEVBID) 0.375 MG 12 hr tablet TAKE 1 TABLET BY MOUTH EVERY 12 HOURS AS NEEDED FOR CRAMPING. 09/28/23   Anabel Halon, MD  ibuprofen (ADVIL) 100 MG/5ML suspension Take 30 mLs (600 mg total) by mouth every 8 (eight) hours as needed for moderate pain  or fever. 07/31/22   Wallis Bamberg, PA-C  SUMAtriptan (IMITREX) 100 MG tablet TAKE 1 TAB BY MOUTH EVERY 2 HOURS AS NEEDED FOR MIGRAINE. MAY REPEAT IN 2 HOURS IF HEADACHE PERSISTS OR RECURS. 08/17/23   Anabel Halon, MD  venlafaxine XR (EFFEXOR-XR) 75 MG 24 hr capsule Take 1 capsule (75 mg total) by mouth daily with breakfast. 09/06/23   Anabel Halon, MD      Allergies    Amoxicillin, Penicillins, Doxycycline, Ketorolac, Rocephin [ceftriaxone], Dicyclomine, and Latex    Review of Systems   Review of Systems  Constitutional:  Negative for fever.  Gastrointestinal:  Positive for nausea and vomiting.  Neurological:  Positive for speech difficulty, numbness and headaches. Negative for syncope and weakness.  All other systems reviewed and are negative.   Physical Exam Updated Vital Signs BP 132/79   Pulse 81   Temp 98.4 F (36.9 C) (Oral)   Resp 18   Ht 1.791 m (5' 10.5")   Wt 86.2 kg   SpO2 99%   BMI 26.88 kg/m  Physical Exam Vitals and nursing note reviewed.  Constitutional:      Appearance: She is well-developed. She is obese. She is ill-appearing. She is not toxic-appearing.     Comments: Actively vomiting  HENT:     Head: Normocephalic and atraumatic.  Eyes:     Pupils: Pupils are equal, round, and reactive to light.  Cardiovascular:  Rate and Rhythm: Normal rate and regular rhythm.     Heart sounds: Normal heart sounds.  Pulmonary:     Effort: Pulmonary effort is normal. No respiratory distress.     Breath sounds: No wheezing.  Abdominal:     Palpations: Abdomen is soft.  Musculoskeletal:     Cervical back: Neck supple.  Skin:    General: Skin is warm and dry.  Neurological:     Mental Status: She is alert and oriented to person, place, and time.     Comments: Dysarthria noted with slurred speech, on motor testing patient has symmetric bilateral upper extremity strength, on lower extremities testing her left leg falls to the bed; however upon lifting it she  resisted me significantly, flexion and extension of the feet are intact  Psychiatric:     Comments: Anxious appearing     ED Results / Procedures / Treatments   Labs (all labs ordered are listed, but only abnormal results are displayed) Labs Reviewed  COMPREHENSIVE METABOLIC PANEL - Abnormal; Notable for the following components:      Result Value   CO2 21 (*)    Glucose, Bld 111 (*)    All other components within normal limits  I-STAT CHEM 8, ED - Abnormal; Notable for the following components:   Potassium 3.4 (*)    Glucose, Bld 106 (*)    Calcium, Ion 1.11 (*)    TCO2 21 (*)    All other components within normal limits  CBG MONITORING, ED - Abnormal; Notable for the following components:   Glucose-Capillary 111 (*)    All other components within normal limits  ETHANOL  PROTIME-INR  APTT  CBC  DIFFERENTIAL  RAPID URINE DRUG SCREEN, HOSP PERFORMED  URINALYSIS, ROUTINE W REFLEX MICROSCOPIC  POC URINE PREG, ED    EKG EKG Interpretation Date/Time:  Sunday October 01 2023 00:12:12 EDT Ventricular Rate:  67 PR Interval:  145 QRS Duration:  100 QT Interval:  404 QTC Calculation: 427 R Axis:   91  Text Interpretation: Sinus rhythm Borderline right axis deviation Confirmed by Ross Marcus ([id number]) on 10/01/2023 1:40:32 AM  Radiology CT HEAD CODE STROKE WO CONTRAST Result Date: 10/01/2023 CLINICAL DATA:  Code stroke. Initial evaluation for acute neuro deficit, stroke suspected. EXAM: CT HEAD WITHOUT CONTRAST TECHNIQUE: Contiguous axial images were obtained from the base of the skull through the vertex without intravenous contrast. RADIATION DOSE REDUCTION: This exam was performed according to the departmental dose-optimization program which includes automated exposure control, adjustment of the mA and/or kV according to patient size and/or use of iterative reconstruction technique. COMPARISON:  None Available. FINDINGS: Brain: Cerebral volume within normal limits for patient  age. No acute intracranial hemorrhage. No acute large vessel territory infarct. No mass lesion, midline shift, or mass effect. Ventricles are normal in size without hydrocephalus. No extra-axial fluid collection. Vascular: No abnormal hyperdense vessel. Skull: Scalp soft tissues demonstrate no acute abnormality. Calvarium intact. Sinuses/Orbits: Globes and orbital soft tissues within normal limits. Visualized paranasal sinuses are largely clear. No significant mastoid effusion. ASPECTS [facility] Stroke Program Early CT Score) - Ganglionic level infarction (caudate, lentiform nuclei, internal capsule, insula, M1-M3 cortex): 7 - Supraganglionic infarction (M4-M6 cortex): 3 Total score (0-10 with 10 being normal): 10 IMPRESSION: 1. Normal head CT.  No acute intracranial abnormality. 2. ASPECTS is 10. Results were called by telephone at the time of interpretation on 10/01/2023 at 12:12 am to provider [facility] , who verbally acknowledged these results. Electronically Signed  By: Rise Mu M.D.   On: 10/01/2023 00:12    Procedures .Critical Care  Performed by: Shon Baton, MD Authorized by: Shon Baton, MD   Critical care provider statement:    Critical care time (minutes):  35   Critical care was necessary to treat or prevent imminent or life-threatening deterioration of the following conditions:  CNS failure or compromise   Critical care was time spent personally by me on the following activities:  Development of treatment plan with patient or surrogate, discussions with consultants, evaluation of patient's response to treatment, examination of patient, ordering and review of laboratory studies, ordering and review of radiographic studies, ordering and performing treatments and interventions, pulse oximetry, re-evaluation of patient's condition and review of old charts     Medications Ordered in ED Medications  prochlorperazine (COMPAZINE) injection 10 mg (10 mg  Intravenous Given 09/30/23 2356)  diphenhydrAMINE (BENADRYL) injection 25 mg (25 mg Intravenous Given 09/30/23 2356)  LORazepam (ATIVAN) injection 1 mg (1 mg Intravenous Given 10/01/23 0140)    ED Course/ Medical Decision Making/ A&P Clinical Course as of 10/01/23 0255  Sun Oct 01, 2023  0050 Teleneurology recommends MRI. [CH]  0129 Patient has hair extensions that are attached with metal clips.  Per the patient, they require special removal and she is unwilling to have this done.  Still has some dysarthria but improving. [CH]  0240 Patient symptoms have fully resolved.  Discussed with the mother and the patient the importance of getting an MRI and a full workup given this is her second episode.  Patient declines MRI inpatient but states that she will follow-up with neurology as an outpatient and have MRI as an outpatient after having her extensions removed.  Given that she has returned to her baseline, feel this is reasonable. [CH]    Clinical Course User Index [CH] Onesimo Lingard, Mayer Masker, MD                                 Medical Decision Making Amount and/or Complexity of Data Reviewed Labs: ordered. Radiology: ordered.  Risk Prescription drug management.   This patient presents to the ED for concern of headache, speech difficulty, this involves an extensive number of treatment options, and is a complaint that carries with it a high risk of complications and morbidity.  I considered the following differential and admission for this acute, potentially life threatening condition.  The differential diagnosis includes complex migraine, stroke, TIA, head bleed  MDM:    This is a 28 year old female who presents with headache, nausea, vomiting, and speech difficulty.  She is actively vomiting on initial evaluation.  Vital signs notable for mild tachycardia.  She does have some dysarthria.  No true weakness that is focal in nature.  Highly suspect complex migraine given her history and her age.   However, given dysarthria, code stroke was initiated and stroke lab work and imaging was obtained.  CT head is reassuring.  Basic lab work is reassuring.  Teleneurology recommends MRI.  Unfortunately, patient has metal hair clips for extensions and cannot have these removed at this time.  Symptoms fully resolved with migraine cocktail and treatment.  Patient wishes to get MRI as an outpatient as well as neurology follow-up.  Given that she is completely back to her baseline, feel that this is reasonable.  Patient and her mother were given return precautions.  (Labs, imaging, consults)  Labs: I Ordered,  and personally interpreted labs.  The pertinent results include: Stroke panel  Imaging Studies ordered: I ordered imaging studies including CT head I independently visualized and interpreted imaging. I agree with the radiologist interpretation  Additional history obtained from chart review.  External records from outside source obtained and reviewed including prior evaluations  Cardiac Monitoring: The patient was maintained on a cardiac monitor.  If on the cardiac monitor, I personally viewed and interpreted the cardiac monitored which showed an underlying rhythm of: Sinus  Reevaluation: After the interventions noted above, I reevaluated the patient and found that they have :resolved  Social Determinants of Health:  lives independently  Disposition: Discharge  Co morbidities that complicate the patient evaluation  Past Medical History:  Diagnosis Date   ADHD (attention deficit hyperactivity disorder)    Anxiety    Depression    Encounter for gynecological examination with Papanicolaou smear of cervix 03/27/2017   Encounter for surveillance of contraceptive pills 03/27/2017   GERD (gastroesophageal reflux disease)    Migraines      Medicines Meds ordered this encounter  Medications   prochlorperazine (COMPAZINE) injection 10 mg   diphenhydrAMINE (BENADRYL) injection 25 mg    LORazepam (ATIVAN) injection 1 mg    I have reviewed the patients home medicines and have made adjustments as needed  Problem List / ED Course: Problem List Items Addressed This Visit   None Visit Diagnoses       Complicated migraine    -  Primary   Relevant Orders   Ambulatory referral to Neurology     Dysarthria       Relevant Orders   Ambulatory referral to Neurology                   Final Clinical Impression(s) / ED Diagnoses Final diagnoses:  Complicated migraine  Dysarthria    Rx / DC Orders ED Discharge Orders          Ordered    Ambulatory referral to Neurology       Comments: An appointment is requested in approximately: 2 weeks   10/01/23 0243              Shon Baton, MD 10/01/23 [phone]

## 2023-10-01 NOTE — Progress Notes (Signed)
 Code stroke activated 2349. EDP assessed states LKW 21:30 states she started with headache, left sided numbness and weakness and trouble speaking. mRs per mom is 0.   To CT 2359. Returned from CT 0006.  Telespecialist paged at 2353. Dr. Romeo Apple on camera for report and exam at 0010. Ct results given at 0016. No TNK plan to admit for MRI. Off camera at 0036.

## 2023-10-01 NOTE — ED Provider Notes (Incomplete)
 [place] EMERGENCY DEPARTMENT AT [facility] Provider Note   CSN: [id number] Arrival date & time: 09/30/23  2330  An emergency department physician performed an initial assessment on this suspected stroke patient at 2349.  History {Add pertinent medical, surgical, social history, OB history to HPI:1} Chief Complaint  Patient presents with  . Migraine  . Aphasia  . Code Stroke    BETHANIA SCHLOTZHAUER is a 28 y.o. female.  HPI     This is a 28 year old female who presents with her mother with concerns for headache, vomiting, slurred speech.  Last seen normal around 9:30 PM when she went to bed.  She woke up just prior to arrival with a headache and left-sided numbness with slurred speech.  Reports that headache is left-sided which corresponds with the side of her symptoms.  Patient tried to take her triptan but vomited it up.  Denies worst headache of her life.  Per the patient's mother, she has significant anxiety which was worsened this week because of stresses at work.  Patient had a similar presentation many years ago and had a full stroke workup at the time because of focal neurologic deficits.  Has not taken her anxiety or blood pressure medications in several days.  Home Medications Prior to Admission medications   Medication Sig Start Date End Date Taking? Authorizing Provider  acetaminophen (TYLENOL) 500 MG tablet Take 500-1,000 mg by mouth every 6 (six) hours as needed for moderate pain or headache.    [provider]  amLODipine (NORVASC) 5 MG tablet TAKE 1 TABLET (5 MG TOTAL) BY MOUTH DAILY. 08/22/23   Anabel Halon, MD  hydrOXYzine (VISTARIL) 25 MG capsule Take 1 capsule (25 mg total) by mouth every 8 (eight) hours as needed for anxiety. 03/24/23   Anabel Halon, MD  hyoscyamine (LEVBID) 0.375 MG 12 hr tablet TAKE 1 TABLET BY MOUTH EVERY 12 HOURS AS NEEDED FOR CRAMPING. 09/28/23   Anabel Halon, MD  ibuprofen (ADVIL) 100 MG/5ML suspension Take 30 mLs  (600 mg total) by mouth every 8 (eight) hours as needed for moderate pain or fever. 07/31/22   Wallis Bamberg, PA-C  SUMAtriptan (IMITREX) 100 MG tablet TAKE 1 TAB BY MOUTH EVERY 2 HOURS AS NEEDED FOR MIGRAINE. MAY REPEAT IN 2 HOURS IF HEADACHE PERSISTS OR RECURS. 08/17/23   Anabel Halon, MD  venlafaxine XR (EFFEXOR-XR) 75 MG 24 hr capsule Take 1 capsule (75 mg total) by mouth daily with breakfast. 09/06/23   Anabel Halon, MD      Allergies    Amoxicillin, Penicillins, Doxycycline, Ketorolac, Rocephin [ceftriaxone], Dicyclomine, and Latex    Review of Systems   Review of Systems  Physical Exam Updated Vital Signs BP 119/78 (BP Location: Left Arm)   Pulse 94   Temp 98.4 F (36.9 C) (Oral)   Resp 20   Ht 1.791 m (5' 10.5")   Wt 86.2 kg   SpO2 98%   BMI 26.88 kg/m  Physical Exam  ED Results / Procedures / Treatments   Labs (all labs ordered are listed, but only abnormal results are displayed) Labs Reviewed  I-STAT CHEM 8, ED - Abnormal; Notable for the following components:      Result Value   Potassium 3.4 (*)    Glucose, Bld 106 (*)    Calcium, Ion 1.11 (*)    TCO2 21 (*)    All other components within normal limits  CBG MONITORING, ED - Abnormal; Notable for the following  components:   Glucose-Capillary 111 (*)    All other components within normal limits  ETHANOL  PROTIME-INR  APTT  CBC  DIFFERENTIAL  COMPREHENSIVE METABOLIC PANEL  RAPID URINE DRUG SCREEN, HOSP PERFORMED  URINALYSIS, ROUTINE W REFLEX MICROSCOPIC  POC URINE PREG, ED    EKG None  Radiology No results found.  Procedures Procedures  {Document cardiac monitor, telemetry assessment procedure when appropriate:1}  Medications Ordered in ED Medications  prochlorperazine (COMPAZINE) injection 10 mg (10 mg Intravenous Given 09/30/23 2356)  diphenhydrAMINE (BENADRYL) injection 25 mg (25 mg Intravenous Given 09/30/23 2356)    ED Course/ Medical Decision Making/ A&P   {   Click here for ABCD2,  HEART and other calculatorsREFRESH Note before signing :1}                              Medical Decision Making Amount and/or Complexity of Data Reviewed Labs: ordered. Radiology: ordered.  Risk Prescription drug management.   ***  {Document critical care time when appropriate:1} {Document review of labs and clinical decision tools ie heart score, Chads2Vasc2 etc:1}  {Document your independent review of radiology images, and any outside records:1} {Document your discussion with family members, caretakers, and with consultants:1} {Document social determinants of health affecting pt's care:1} {Document your decision making why or why not admission, treatments were needed:1} Final Clinical Impression(s) / ED Diagnoses Final diagnoses:  None    Rx / DC Orders ED Discharge Orders     None

## 2023-10-01 NOTE — Consult Note (Signed)
 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 expressive aphasia  head CT is negative. NIHSS 4    given she has had similar presentations to this in past and carries dx of complicated migraine, IV thrombolytics were not given to patient    However, recommend treatment for migraine but would also r/o stroke with MRI brain (though suspicion for this is much lower)    Our recommendations are outlined below.  Recommendations:        Neuro Checks       Bedside Swallow Eval       IV Fluids, Normal Saline       Head of Bed 30 Degrees       Euglycemia and Avoid Hyperthermia (PRN Acetaminophen)       received migraine cocktail already and is now sleeping.       if headache persists upon waking up and still symptomatic, recommend admission for observation and headache management.       MRI brain  Sign Out:       Discussed with Emergency Department Provider    ------------------------------------------------------------------------------  Advanced Imaging: Advanced Imaging Deferred because:  Non-disabling symptoms as verified by the patient; no cortical signs so not consistent with LVO   Metrics: Last Known Well: 09/30/2023 21:30:00 Dispatch Time: 09/30/2023 23:53:43 Arrival Time: 09/30/2023 23:30:00 Initial Response Time: 10/01/2023 00:01:20 Symptoms: headache and left sided weakness, numbness and speech changes. Initial patient interaction: 10/01/2023 00:14:56 NIHSS Assessment Completed: 10/01/2023 00:20:18 Patient is not a  candidate for Thrombolytic. Thrombolytic Medical Decision: 10/01/2023 00:20:22 Patient was not deemed candidate for Thrombolytic because of following reasons: other diagnosis suspected migraine suspected.  I personally Reviewed the CT Head and it Showed no acute abnomality  Primary Provider Notified of Diagnostic Impression and Management Plan on: 10/01/2023 00:46:03    ------------------------------------------------------------------------------  History of Present Illness: Patient is a 28 year old Female.  Patient was brought by private transportation with symptoms of headache and left sided weakness, numbness and speech changes. 28 year old lady with hx of migraines, anxiety presenting with left sided weakness and speech changes. Her stepfather saw her around 9-930 pm and she was okay/normal. She woke up with a severe headache and was trying to take a sumatriptan but she had emesis. they noticed she was slurring speech and had left sided weakness. Mother says she has been under a lot of stress.  In 2018: she was diagnosed with complex migraines when she presented with symptoms almost identical to this one per mother. She had difficulty speaking and left sided weakness. She was discharged from the ED after symptoms improved. She had another similar episode in 2020. She does not have an MRI in the system. Mother says she has not seen a neurologist.  She has sumatriptan and another medication but she has not been taking it.    Past Medical History:      Hypertension Other PMH:  anxiety  migraine headaches: diagnosed with complex migraines  tachycardia  Medications:  No Anticoagulant use  No Antiplatelet use Reviewed EMR  for current medications  Allergies:  Reviewed Description: amoxacillin, pcn  Social History: Patient Is: Single Smoking: No Alcohol Use: Former Drug Use: No  Family History:  There is no family history of premature cerebrovascular disease pertinent  to this consultation  ROS : 14 Points Review of Systems was performed and was negative except mentioned in HPI.  Past Surgical History: There Is No Surgical History Contributory To Today's Visit     Examination: BP(119/78), Pulse(94), Blood Glucose(111) 1A: Level of Consciousness - Alert; keenly responsive + 0 1B: Ask Month and Age - Both Questions Right + 0 1C: Blink Eyes & Squeeze Hands - Performs Both Tasks + 0 2: Test Horizontal Extraocular Movements - Normal + 0 3: Test Visual Fields - No Visual Loss + 0 4: Test Facial Palsy (Use Grimace if Obtunded) - Normal symmetry + 0 5A: Test Left Arm Motor Drift - No Drift for 10 Seconds + 0 5B: Test Right Arm Motor Drift - No Drift for 10 Seconds + 0 6A: Test Left Leg Motor Drift - Drift, but doesn't hit bed + 1 6B: Test Right Leg Motor Drift - No Drift for 5 Seconds + 0 7: Test Limb Ataxia (FNF/Heel-Shin) - No Ataxia + 0 8: Test Sensation - Mild-Moderate Loss: Less Sharp/More Dull + 1 9: Test Language/Aphasia - Mild-Moderate Aphasia: Some Obvious Changes, Without Significant Limitation + 1 10: Test Dysarthria - Mild-Moderate Dysarthria: Slurring but can be understood + 1 11: Test Extinction/Inattention - No abnormality + 0  NIHSS Score: 4  NIHSS Free Text : left hand and leg numbness  mild slurred speech  right facial weakness  Pre-Morbid Modified Rankin Scale: 0 Points = No symptoms at all  Spoke with : Dr. Wilkie Aye I reviewed the available imaging via [facility] and initiated discussion with the primary provider  This consult was conducted in real time using interactive audio and Immunologist. Patient was informed of the technology being used for this visit and agreed to proceed. Patient located in hospital and provider located at home/office setting.   Patient is being evaluated for possible acute neurologic impairment and high probability of imminent or life-threatening deterioration. I spent total of 45 minutes providing care  to this patient, including time for face to face visit via telemedicine, review of medical records, imaging studies and discussion of findings with providers, the patient and/or family.   Dr Ginette Otto   [facility] For Inpatient follow-up with [facility] physician please call [facility] at [phone]. As we are not an outpatient service for any post hospital discharge needs please contact the hospital for assistance. If you have any questions for the [facility] physicians or need to reconsult for clinical or diagnostic changes please contact us via [facility] at [phone].

## 2023-10-09 NOTE — Telephone Encounter (Signed)
 Copied from CRM [id number]. Topic: Appointments - Appointment Scheduling >> Oct 09, 2023 12:12 PM Pierre Bali B wrote: Patient/patient representative is calling to schedule an appointment. Refer to attachments for appointment information. Patient would like to see if Dr. Allena Katz can look at her clinical notes from hospital visit from 10/05/2023 and is there is any cancellations for Friday 10/27/2023 around 2pm so she can have her mother present would be great if not then she will go to the 10/23/2023 visit at 11am

## 2023-10-23 ENCOUNTER — Encounter: Payer: Self-pay | Admitting: Internal Medicine

## 2023-10-23 ENCOUNTER — Ambulatory Visit (INDEPENDENT_AMBULATORY_CARE_PROVIDER_SITE_OTHER): Payer: Self-pay | Admitting: Internal Medicine

## 2023-10-23 VITALS — BP 144/86 | HR 117 | Ht 70.5 in | Wt 201.8 lb

## 2023-10-23 DIAGNOSIS — Z09 Encounter for follow-up examination after completed treatment for conditions other than malignant neoplasm: Secondary | ICD-10-CM | POA: Insufficient documentation

## 2023-10-23 DIAGNOSIS — G43919 Migraine, unspecified, intractable, without status migrainosus: Secondary | ICD-10-CM | POA: Diagnosis not present

## 2023-10-23 DIAGNOSIS — I10 Essential (primary) hypertension: Secondary | ICD-10-CM

## 2023-10-23 DIAGNOSIS — F411 Generalized anxiety disorder: Secondary | ICD-10-CM | POA: Diagnosis not present

## 2023-10-23 MED ORDER — PROPRANOLOL HCL 20 MG PO TABS: 20.0000 mg | ORAL_TABLET | Freq: Two times a day (BID) | ORAL | 3 refills | Status: DC

## 2023-10-23 NOTE — Assessment & Plan Note (Signed)
 Was better controlled with Effexor 75 mg once daily, but recently worse due to losing job Considering inattention (ADHD) symptoms, had increased dose of Effexor to 75 mg QD Had to stop Wellbutrin due to constipation Has episodes of anxiety at times based on situation, has Vistaril PRN Has tried Wellbutrin, Zoloft, Lexapro, Cymbalta and BuSpar Added propranolol for migraine PPx, situational anxiety and essential tremor

## 2023-10-23 NOTE — Progress Notes (Signed)
 Established Patient Office Visit  Subjective:  Patient ID: Kim [name], female    DOB: [birth year]  Age: 28 y.o. MRN: [id number]  CC:  Chief Complaint  Patient presents with   Follow-up    Hospital f/u, still been having left side weakness.     HPI Kim [name] is a 28 y.o. female with past medical history of migraine, GAD and IBS who presents for follow-up after recent ER visit.  She went to ER on 09/30/2023 for severe headache, left-sided numbness and slurred speech.  Code stroke was called, head CT of her, which was unremarkable.  She had tried taking sumatriptan before the ER visit, but had vomiting after it.  Her mother reports that she has been stressed due to losing her job around that time.  She had stopped taking amlodipine and venlafaxine according to ER note, but she reports taking them now.  She still reports episodes of unilateral headache and left-sided UE weakness.  She has intermittent numbness and tingling of the UE and LE as well.  She was referred to [facility] neurology from ER visit, but has not scheduled visit yet.  She still has spells of anxiety and insomnia, lack of appetite.    Past Medical History:  Diagnosis Date   ADHD (attention deficit hyperactivity disorder)    Anxiety    Depression    Encounter for gynecological examination with Papanicolaou smear of cervix 03/27/2017   Encounter for surveillance of contraceptive pills 03/27/2017   GERD (gastroesophageal reflux disease)    Migraines     Past Surgical History:  Procedure Laterality Date   BIOPSY  01/06/2021   Procedure: BIOPSY;  Surgeon: Malissa Hippo, MD;  [facility];  Service: Endoscopy;;   COLONOSCOPY WITH PROPOFOL N/A 01/06/2021   Procedure: COLONOSCOPY WITH PROPOFOL;  Surgeon: Malissa Hippo, MD;  [facility];  Service: Endoscopy;  Laterality: N/A;  1:20   ESOPHAGOGASTRODUODENOSCOPY (EGD) WITH PROPOFOL N/A 01/06/2021   Procedure:  ESOPHAGOGASTRODUODENOSCOPY (EGD) WITH PROPOFOL;  Surgeon: Malissa Hippo, MD;  [facility];  Service: Endoscopy;  Laterality: N/A;    Family History  Problem Relation Age of Onset   Cancer Paternal Grandfather    Alzheimer's disease Paternal Grandmother    Cancer Maternal Grandmother        breast    Cancer Maternal Grandfather        pancreatic and bone   Cancer Father        lung   Hypertension Mother     Social History   Socioeconomic History   Marital status: Single    Spouse name: Not on file   Number of children: Not on file   Years of education: Not on file   Highest education level: Associate degree: occupational, Scientist, product/process development, or vocational program  Occupational History   Not on file  Tobacco Use   Smoking status: Never   Smokeless tobacco: Never  Vaping Use   Vaping status: Never Used  Substance and Sexual Activity   Alcohol use: Not Currently    Comment: monthly   Drug use: No   Sexual activity: Yes    Birth control/protection: I.U.D.  Other Topics Concern   Not on file  Social History Narrative   Lives with mom, step dad      Pets: 3 dogs: Skimp, Ginger, and Evie May      Enjoys: read-genre: poetry, movies and music      Diet: eats all food groups,  snacks with stress   Caffeine: coffee-2 cups; tea daily, mountain dew    Water: 1-2 cups daily       Does not wear seat belt due to being anxiety    Does not use phone while driving   Smoke detectors at home    Social Drivers of Health   Financial Resource Strain: Not on file  Food Insecurity: Not on file  Transportation Needs: Not on file  Physical Activity: Not on file  Stress: Not on file  Social Connections: Not on file  Intimate Partner Violence: Not on file    Outpatient Medications Prior to Visit  Medication Sig Dispense Refill   acetaminophen (TYLENOL) 500 MG tablet Take 500-1,000 mg by mouth every 6 (six) hours as needed for moderate pain or headache.     amLODipine  (NORVASC) 5 MG tablet TAKE 1 TABLET (5 MG TOTAL) BY MOUTH DAILY. 90 tablet 1   hydrOXYzine (VISTARIL) 25 MG capsule Take 1 capsule (25 mg total) by mouth every 8 (eight) hours as needed for anxiety. 30 capsule 2   hyoscyamine (LEVBID) 0.375 MG 12 hr tablet TAKE 1 TABLET BY MOUTH EVERY 12 HOURS AS NEEDED FOR CRAMPING. 180 tablet 1   ibuprofen (ADVIL) 100 MG/5ML suspension Take 30 mLs (600 mg total) by mouth every 8 (eight) hours as needed for moderate pain or fever. 500 mL 1   SUMAtriptan (IMITREX) 100 MG tablet TAKE 1 TAB BY MOUTH EVERY 2 HOURS AS NEEDED FOR MIGRAINE. MAY REPEAT IN 2 HOURS IF HEADACHE PERSISTS OR RECURS. 10 tablet 2   venlafaxine XR (EFFEXOR-XR) 75 MG 24 hr capsule TAKE 1 CAPSULE BY MOUTH DAILY WITH BREAKFAST. 90 capsule 2   No facility-administered medications prior to visit.    Allergies  Allergen Reactions   Amoxicillin Rash   Penicillins Rash   Doxycycline Hives   Ketorolac Hives   Rocephin [Ceftriaxone] Hives   Dicyclomine Rash   Latex Itching    ROS Review of Systems  Constitutional:  Positive for fatigue. Negative for chills and fever.  HENT:  Negative for congestion, sinus pressure, sinus pain and sore throat.   Eyes:  Negative for pain and discharge.  Respiratory:  Negative for cough and shortness of breath.   Cardiovascular:  Negative for chest pain and palpitations.  Gastrointestinal:  Positive for constipation. Negative for nausea and vomiting.  Endocrine: Negative for polydipsia and polyuria.  Genitourinary:  Negative for dysuria and hematuria.  Musculoskeletal:  Negative for neck pain and neck stiffness.  Skin:  Negative for rash.  Neurological:  Positive for weakness (LUE), numbness (Intermittent-B/l UE and LE) and headaches. Negative for dizziness.  Psychiatric/Behavioral:  Negative for agitation and behavioral problems. The patient is nervous/anxious.       Objective:    Physical Exam Vitals reviewed.  Constitutional:      General: She is  not in acute distress.    Appearance: She is not diaphoretic.  HENT:     Head: Normocephalic and atraumatic.     Nose: Nose normal. No congestion.     Mouth/Throat:     Mouth: Mucous membranes are moist.     Pharynx: No posterior oropharyngeal erythema.  Eyes:     General: No scleral icterus.    Extraocular Movements: Extraocular movements intact.  Cardiovascular:     Rate and Rhythm: Normal rate and regular rhythm.     Heart sounds: Normal heart sounds. No murmur heard. Pulmonary:     Breath sounds: Normal breath sounds. No  wheezing or rales.  Musculoskeletal:     Cervical back: Neck supple. No tenderness.     Right lower leg: No edema.     Left lower leg: No edema.  Skin:    General: Skin is warm.     Findings: No rash.  Neurological:     General: No focal deficit present.     Mental Status: She is alert and oriented to person, place, and time.     Cranial Nerves: No cranial nerve deficit.     Sensory: No sensory deficit.     Motor: Tremor present. No weakness.     Comments: B/l hand tremors  Psychiatric:        Mood and Affect: Mood is anxious.        Behavior: Behavior normal.     BP (!) 144/86 (BP Location: Right Arm)   Pulse (!) 117   Ht 5' 10.5" (1.791 m)   Wt 201 lb 12.8 oz (91.5 kg)   SpO2 98%   BMI 28.55 kg/m  Wt Readings from Last 3 Encounters:  10/23/23 201 lb 12.8 oz (91.5 kg)  09/30/23 190 lb (86.2 kg)  09/06/23 199 lb (90.3 kg)    Lab Results  Component Value Date   TSH 1.730 03/07/2022   Lab Results  Component Value Date   WBC 7.7 09/30/2023   HGB 12.9 09/30/2023   HCT 38.0 09/30/2023   MCV 88.4 09/30/2023   PLT 257 09/30/2023   Lab Results  Component Value Date   NA 141 09/30/2023   K 3.4 (L) 09/30/2023   CO2 21 (L) 09/30/2023   GLUCOSE 106 (H) 09/30/2023   BUN 6 09/30/2023   CREATININE 0.60 09/30/2023   BILITOT 0.4 09/30/2023   ALKPHOS 47 09/30/2023   AST 16 09/30/2023   ALT 15 09/30/2023   PROT 7.4 09/30/2023   ALBUMIN 3.9  09/30/2023   CALCIUM 8.9 09/30/2023   ANIONGAP 11 09/30/2023   EGFR 117 03/24/2023   Lab Results  Component Value Date   CHOL 143 03/07/2022   Lab Results  Component Value Date   HDL 45 03/07/2022   Lab Results  Component Value Date   LDLCALC 85 03/07/2022   Lab Results  Component Value Date   TRIG 61 03/07/2022   Lab Results  Component Value Date   CHOLHDL 3.2 03/07/2022   Lab Results  Component Value Date   HGBA1C 4.9 03/07/2022      Assessment & Plan:   Problem List Items Addressed This Visit       Cardiovascular and Mediastinum   Complicated migraine, intractable - Primary   Worsening of migraine frequency and intensity recently Maxalt was ineffective Has sumatriptan as needed for now Added propranolol for migraine PPx, can also help with situational anxiety, essential tremor and HTN Advised to contact [facility] for scheduling appointment      Relevant Medications   propranolol (INDERAL) 20 MG tablet   Essential hypertension   BP Readings from Last 1 Encounters:  10/23/23 (!) 144/86   Uncontrolled now with Amlodipine 5 mg once daily Has tachycardia, likely due to situational anxiety - added propranolol Counseled for compliance with the medications Advised DASH diet and moderate exercise/walking, at least 150 mins/week       Relevant Medications   propranolol (INDERAL) 20 MG tablet     Other   GAD (generalized anxiety disorder)   Was better controlled with Effexor 75 mg once daily, but recently worse due to losing job Considering  inattention (ADHD) symptoms, had increased dose of Effexor to 75 mg QD Had to stop Wellbutrin due to constipation Has episodes of anxiety at times based on situation, has Vistaril PRN Has tried Wellbutrin, Zoloft, Lexapro, Cymbalta and BuSpar Added propranolol for migraine PPx, situational anxiety and essential tremor      Encounter for examination following treatment at hospital   ER chart reviewed,  including imaging Her presentation is likely of complex migraine in addition to panic episode - needs neurology evaluation for complex migraine       Meds ordered this encounter  Medications   propranolol (INDERAL) 20 MG tablet    Sig: Take 1 tablet (20 mg total) by mouth 2 (two) times daily.    Dispense:  60 tablet    Refill:  3    Follow-up: Return if symptoms worsen or fail to improve.    Meldon Sport, MD

## 2023-10-23 NOTE — Assessment & Plan Note (Addendum)
 Worsening of migraine frequency and intensity recently Maxalt was ineffective Has sumatriptan as needed for now Added propranolol for migraine PPx, can also help with situational anxiety, essential tremor and HTN Advised to contact [facility] neurology for scheduling appointment

## 2023-10-23 NOTE — Patient Instructions (Addendum)
 Please start taking Propranolol 20 mg twice daily.  If your blood pressure is less than 110/60, please stop taking Amlodipine.  Please contact [facility] for migraine evaluation.  Please continue to take other medications as prescribed.

## 2023-10-23 NOTE — Assessment & Plan Note (Signed)
 ER chart reviewed, including imaging Her presentation is likely of complex migraine in addition to panic episode - needs neurology evaluation for complex migraine

## 2023-10-23 NOTE — Assessment & Plan Note (Addendum)
 BP Readings from Last 1 Encounters:  10/23/23 (!) 144/86   Uncontrolled now with Amlodipine 5 mg once daily Has tachycardia, likely due to situational anxiety - added propranolol Counseled for compliance with the medications Advised DASH diet and moderate exercise/walking, at least 150 mins/week

## 2023-10-24 ENCOUNTER — Encounter: Payer: Self-pay | Admitting: Neurology

## 2023-10-24 ENCOUNTER — Ambulatory Visit (INDEPENDENT_AMBULATORY_CARE_PROVIDER_SITE_OTHER): Admitting: Neurology

## 2023-10-24 VITALS — BP 149/90 | HR 112 | Ht 70.0 in | Wt 202.0 lb

## 2023-10-24 DIAGNOSIS — F410 Panic disorder [episodic paroxysmal anxiety] without agoraphobia: Secondary | ICD-10-CM | POA: Diagnosis not present

## 2023-10-24 DIAGNOSIS — F411 Generalized anxiety disorder: Secondary | ICD-10-CM | POA: Diagnosis not present

## 2023-10-24 DIAGNOSIS — G43101 Migraine with aura, not intractable, with status migrainosus: Secondary | ICD-10-CM

## 2023-10-24 DIAGNOSIS — G43919 Migraine, unspecified, intractable, without status migrainosus: Secondary | ICD-10-CM

## 2023-10-24 DIAGNOSIS — G43409 Hemiplegic migraine, not intractable, without status migrainosus: Secondary | ICD-10-CM | POA: Diagnosis not present

## 2023-10-24 MED ORDER — ALPRAZOLAM 0.25 MG PO TABS: 0.2500 mg | ORAL_TABLET | Freq: Two times a day (BID) | ORAL | 0 refills | Status: AC | PRN

## 2023-10-24 MED ORDER — VERAPAMIL HCL ER 120 MG PO TBCR: 120.0000 mg | EXTENDED_RELEASE_TABLET | Freq: Every day | ORAL | 5 refills | Status: DC

## 2023-10-24 NOTE — Patient Instructions (Signed)

## 2023-10-24 NOTE — Progress Notes (Signed)
 [facility]  Headache referral through ED   Provider:  Melvyn Novas, MD  Referring Provider: Shon Baton, MD, Emergency room  Primary Care Physician:  Anabel Halon, MD PCP   Chief Complaint  Patient presents with   Migraine    Rm 2 with mother Pt is well, reports she started having headaches around 2014. headaches worsen during menstrual cycle. She has 2-3 headaches a month.  Associated dizziness, nausea,vomiting, sensitivities to light, sound, smell, motion, numbness on L side and slurred speech. She went to ED 3/22 and was treated for code stroke.     HPI:  Kim [name] is a 28 y.o. female and seen here upon referral from Dr. Wilkie Aye for a Consultation/ Evaluation of migraine headaches .  This patient reports onset of new quality migraines with visual auras, zig-zag vision lines, numbness over the left face .  This let to an ED evaluation on  10-01-2023,  23.30 , after left side became  numb  and weaker, leaving her with slurred speech, balance difficulties.  Pt here with c/o migraine. Per mother, pt had a migraine and then developed slurred speech and L sided numbness. Mother states pt has hx of anxiety and a "very stressful week". Pt tried to take her migraine medicine but vomited it up per family.   This is a 28 year old female who presents with her mother with concerns for headache, vomiting, slurred speech. Last seen normal around 9:30 PM when she went to bed. She woke up just prior to arrival with a headache and left-sided numbness with slurred speech. Reports that headache is left-sided which corresponds with the side of her symptoms. Patient tried to take her triptan but vomited it up. Denies worst headache of her life. Per the patient's mother, she has significant anxiety which was worsened this week because of stresses at work. Patient had a similar presentation many years ago and had a full stroke workup at the time because of focal neurologic  deficits. Has not taken her anxiety or blood pressure medications in several days.   Maternal history of high BP, aunt with BP.  This Patient was born  to a  66 year -old mother , gravida 2, para 1 in the 7 th months of gestation, 18 days in NICU.  Emergency cessarian section after vaginal hemorrhaging .   Sruti migraines set on at the time of her first period,  age 34.  Migraines were related to menstrual cycle, nauseating, vomiting relieved some of the pain.  Even today she has pressure that gets some relief  with vomiting.  These headaches now are  associated with hemiplegic symptoms. First time onset 2018.  Associated with acute stress.  She lost her job on March 18th and anxiety peaked, leading to the exacerbation on 09-30-2023.    She worked as a Museum/gallery curator .  Early shift work.  Non smoker, non drinker, caffeine; yes :  coffee and soda , 6 drinks a day.  Energy drinks - she stopped these 3 months ago.  Lives with parents.  Not driving.  Failed the driving test due to anxiety.   Regular exercise limited by SOB .    Review of Systems: Out of a complete 14 system review, the patient complains of only the following symptoms, and all other reviewed systems are negative. Anxiety,  visual aura,  nausea, hemi -numbness.   Social History   Socioeconomic History   Marital status: Single    Spouse  name: Not on file   Number of children: Not on file   Years of education: Not on file   Highest education level: Associate degree: occupational, Scientist, product/process development, or vocational program  Occupational History   Not on file  Tobacco Use   Smoking status: Never   Smokeless tobacco: Never  Vaping Use   Vaping status: Never Used  Substance and Sexual Activity   Alcohol use: Not Currently    Comment: monthly   Drug use: No   Sexual activity: Yes    Birth control/protection: I.U.D.  Other Topics Concern   Not on file  Social History Narrative   Lives with mom, step dad      Pets:  3 dogs: Skimp, Ginger, and Evie May      Enjoys: read-genre: poetry, movies and music      Diet: eats all food groups, snacks with stress   Caffeine: coffee-2 cups; tea daily, mountain dew    Water: 1-2 cups daily       Does not wear seat belt due to being anxiety    Does not use phone while driving   Smoke detectors at home    Social Drivers of Health   Financial Resource Strain: Not on file  Food Insecurity: Not on file  Transportation Needs: Not on file  Physical Activity: Not on file  Stress: Not on file  Social Connections: Not on file  Intimate Partner Violence: Not on file    Family History  Problem Relation Age of Onset   Hypertension Mother    Cancer Father        lung   Cancer Maternal Grandmother        breast    Migraines Maternal Grandmother    Cancer Maternal Grandfather        pancreatic and bone   Alzheimer's disease Paternal Grandmother    Cancer Paternal Grandfather     Past Medical History:  Diagnosis Date   ADHD (attention deficit hyperactivity disorder)    Anxiety    Depression    Encounter for gynecological examination with Papanicolaou smear of cervix 03/27/2017   Encounter for surveillance of contraceptive pills 03/27/2017   GERD (gastroesophageal reflux disease)    Migraines     Past Surgical History:  Procedure Laterality Date   BIOPSY  01/06/2021   Procedure: BIOPSY;  Surgeon: Ruby Corporal, MD;  [facility];  Service: Endoscopy;;   COLONOSCOPY WITH PROPOFOL N/A 01/06/2021   Procedure: COLONOSCOPY WITH PROPOFOL;  Surgeon: Ruby Corporal, MD;  [facility];  Service: Endoscopy;  Laterality: N/A;  1:20   ESOPHAGOGASTRODUODENOSCOPY (EGD) WITH PROPOFOL N/A 01/06/2021   Procedure: ESOPHAGOGASTRODUODENOSCOPY (EGD) WITH PROPOFOL;  Surgeon: Ruby Corporal, MD;  [facility];  Service: Endoscopy;  Laterality: N/A;    Current Outpatient Medications  Medication Sig Dispense Refill   acetaminophen  (TYLENOL) 500 MG tablet Take 500-1,000 mg by mouth every 6 (six) hours as needed for moderate pain or headache.     amLODipine (NORVASC) 5 MG tablet TAKE 1 TABLET (5 MG TOTAL) BY MOUTH DAILY. 90 tablet 1   Cyanocobalamin (VITAMIN B-12) 3000 MCG/ML LIQD Take 3,000 mcg by mouth daily.     hydrOXYzine (VISTARIL) 25 MG capsule Take 1 capsule (25 mg total) by mouth every 8 (eight) hours as needed for anxiety. 30 capsule 2   hyoscyamine (LEVBID) 0.375 MG 12 hr tablet TAKE 1 TABLET BY MOUTH EVERY 12 HOURS AS NEEDED FOR CRAMPING. 180 tablet  1   Multiple Vitamin (MULTIVITAMIN) tablet Take 1 tablet by mouth daily.     propranolol (INDERAL) 20 MG tablet Take 1 tablet (20 mg total) by mouth 2 (two) times daily. 60 tablet 3   SUMAtriptan (IMITREX) 100 MG tablet TAKE 1 TAB BY MOUTH EVERY 2 HOURS AS NEEDED FOR MIGRAINE. MAY REPEAT IN 2 HOURS IF HEADACHE PERSISTS OR RECURS. 10 tablet 2   venlafaxine XR (EFFEXOR-XR) 75 MG 24 hr capsule TAKE 1 CAPSULE BY MOUTH DAILY WITH BREAKFAST. 90 capsule 2   No current facility-administered medications for this visit.    Allergies as of 10/24/2023 - Review Complete 10/24/2023  Allergen Reaction Noted   Amoxicillin Rash 07/22/2012   Penicillins Rash 07/22/2012   Doxycycline Hives 01/10/2020   Ketorolac Hives 07/31/2023   Rocephin [ceftriaxone] Hives 07/31/2023   Dicyclomine Rash 08/12/2019   Latex Itching 08/12/2019    Vitals: BP (!) 149/90   Pulse (!) 112   Ht 5\' 10"  (1.778 m)   Wt 202 lb (91.6 kg)   BMI 28.98 kg/m  Last Weight:  Wt Readings from Last 1 Encounters:  10/24/23 202 lb (91.6 kg)   Last Height:   Ht Readings from Last 1 Encounters:  10/24/23 5\' 10"  (1.778 m)   Last BMI: @LASTBMI  Physical exam:  General: The patient is awake, alert and appears not in acute distress.  The patient is well groomed. Head: Normocephalic, atraumatic.  Neck is supple.   Neck circumference:   15  Cardiovascular:  Regular rate and palpable peripheral pulse:   Respiratory: clear to auscultation.   Skin:  Without evidence of edema, or rash  Neurologic exam : The patient is awake and alert, oriented to place and time.  Memory subjective described as intact.  There is a normal attention span & concentration ability.  Speech is fluent without  dysarthria, dysphonia or aphasia.  Mood and affect are anxious, jittery.   Cranial nerves: Pupils are equal and briskly reactive to light. Funduscopic exam without  evidence of pallor or edema. Extraocular movements  in vertical and horizontal planes intact and without nystagmus.  Visual fields by finger perimetry are intact. Hearing to finger rub intact- louder felt on the right, interestingly the tuning fork bone conduction was also louder on the right side. .  Facial sensation intact to fine touch. Facial motor strength is symmetric and tongue and uvula move midline.  Motor exam:   Normal tone and normal muscle bulk and symmetric normal strength in all extremities. Grip Strength equal - it took many attempts to encourage the patient to provide more strength and felt  limited by non-organic factors.  Proximal strength of shoulder muscles and hip flexors was intact .  Sensory:  Fine touch and vibration were tested .  Vibration was felt better on the right side. Right knee, right hand/ wrist . Proprioception was tested in the upper extremities only and was normal.  Coordination: Rapid alternating movements in the fingers/hands were slowed, functionally slowed.   Finger-to-nose maneuver was tested and showed no evidence of ataxia, dysmetria and only mild tremor with action. tremor.  Gait and station: Patient walked without assistive device  Core Strength within normal limits. Stance is stable and of normal base.  Tandem gait is unimpaired  , turns with 3 Steps are unfragmented.  Romberg testing is normal.  Deep tendon reflexes: in the  upper and lower extremities are symmetric and  Brisk-   without Clonus.  2 plus.  Babinski maneuver response is  downgoing.   Assessment: Total time for face to face interview and examination, for review of  images and laboratory testing, neurophysiology testing and pre-existing records, including out-of -network , was 45 minutes. 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  Possible non -organic contribution to  the exact split  pattern of hemi- numbness and hemiplegia.    Plan:  Treatment plan and additional workup planned after today includes:    1) CT head was negative; normal.  Needs MRI now.  Premedication with xanax ordered.   2) She was placed on sumatriptan by PCP. Propanolol for prevention.  I prefer calcium channel blocker verapamil for prevention in hemiplegic migraines.  I want the patient to be as physically active  as possible.  Verapamil at bedtime.   Reduce caffeine - 2  drinks a day, none after 3 PM.   Hydration.   3)  PCP- please  consider referral to psychiatry for anxiety/ depression.  May be needing Cognitive behaviour therapy ? Neomia Banner, MD  [facility] Sleep Board certified by The [employer] of Sleep Medicine and Diplomate of the [employer]. Board certified In Neurology through the ABPN, Fellow of the [employer] Neu[employer].

## 2023-10-26 ENCOUNTER — Telehealth: Payer: Self-pay | Admitting: Neurology

## 2023-10-26 NOTE — Telephone Encounter (Signed)
 sent to GI they obtain Drucie Opitz [phone]

## 2024-03-05 ENCOUNTER — Encounter: Payer: Self-pay | Admitting: Internal Medicine

## 2024-03-05 ENCOUNTER — Ambulatory Visit (INDEPENDENT_AMBULATORY_CARE_PROVIDER_SITE_OTHER): Payer: 59 | Admitting: Internal Medicine

## 2024-03-05 VITALS — BP 138/86 | HR 100 | Ht 70.0 in | Wt 209.0 lb

## 2024-03-05 DIAGNOSIS — F410 Panic disorder [episodic paroxysmal anxiety] without agoraphobia: Secondary | ICD-10-CM

## 2024-03-05 DIAGNOSIS — G43409 Hemiplegic migraine, not intractable, without status migrainosus: Secondary | ICD-10-CM

## 2024-03-05 DIAGNOSIS — K582 Mixed irritable bowel syndrome: Secondary | ICD-10-CM

## 2024-03-05 DIAGNOSIS — I10 Essential (primary) hypertension: Secondary | ICD-10-CM

## 2024-03-05 MED ORDER — VERAPAMIL HCL ER 180 MG PO TBCR: 180.0000 mg | EXTENDED_RELEASE_TABLET | Freq: Every day | ORAL | 5 refills | Status: AC

## 2024-03-05 NOTE — Assessment & Plan Note (Signed)
 Takes Hyoscyamine PRN for abdominal cramping Has episodes of constipation, advised to take MiraLAX as needed and to avoid hyoscyamine when she has constipation

## 2024-03-05 NOTE — Assessment & Plan Note (Signed)
 Maxalt  was ineffective Has sumatriptan  as needed for now Has Verapamil  for migraine PPx, can also help with situational anxiety and HTN Followed by [facility]

## 2024-03-05 NOTE — Patient Instructions (Addendum)
 Please take Verapamil  180 mg instead of 120 mg. Please stop taking Amlodipine .  Please continue to take medications as prescribed.  Please continue to follow low salt diet and perform moderate exercise/walking at least 150 mins/week.

## 2024-03-05 NOTE — Assessment & Plan Note (Addendum)
 BP Readings from Last 1 Encounters:  03/05/24 138/86   Well-controlled now with Amlodipine  5 mg once daily Has tachycardia, likely due to situational anxiety - has Verapamil  for migraine now Due to amlodipine  and verapamil  both being calcium channel blockers, discontinue amlodipine , increase dose of verapamil  to 180 mg QD instead, which can also help with palpitations Counseled for compliance with the medications Advised DASH diet and moderate exercise/walking, at least 150 mins/week

## 2024-03-05 NOTE — Assessment & Plan Note (Signed)
 Overall well-controlled with Effexor  Has Vistaril  as needed for spells of anxiety/panic episode

## 2024-03-05 NOTE — Progress Notes (Signed)
 Established Patient Office Visit  Subjective:  Patient ID: Kim [name], female    DOB: 12-24-1995  Age: 28 y.o. MRN: [id number]  CC:  Chief Complaint  Patient presents with   Hypertension    Follow up   Anxiety    Follow up     HPI Kim [name] is a 28 y.o. female with past medical history of migraine, GAD and chronic diarrhea who presents for f/u of her chronic medical conditions/annual physical.  HTN: Her BP has been better controlled with amlodipine  now at home.  She was placed on verapamil  by neurology for migraine.  She has intermittent palpitations, likely related to anxiety. Her HR remains in 100s during office visits. Her mother had HTN is her 75s.  GAD: She is feeling better with the Effexor  compared to prior.  She has episodes of difficulty concentrating.  She was diagnosed with ADHD in her early 48s, was given Adderall, but did not tolerate it. She has hydroxyzine  as needed for panic episodes/severe anxiety.  She has tried Wellbutrin , Zoloft , Lexapro , Cymbalta and BuSpar , but did not help much.  She has history of panic episode in the past.  Denies any recent panic episode since leaving her job at [employer].  Denies any SI or HI currently.  IBS: She has history of chronic diarrhea from IBS.  She takes hyoscyamine  as needed for diarrhea and abdominal cramping.  Of note, she has been having constipation recently.  Denies any melena or hematochezia.  She has tried Dulcolax in the past.  She did take MiraLAX as needed for constipation, which has helped her.    Past Medical History:  Diagnosis Date   ADHD (attention deficit hyperactivity disorder)    Anxiety    Depression    Encounter for gynecological examination with Papanicolaou smear of cervix 03/27/2017   Encounter for surveillance of contraceptive pills 03/27/2017   GERD (gastroesophageal reflux disease)    Migraines     Past Surgical History:  Procedure Laterality Date   BIOPSY  01/06/2021    Procedure: BIOPSY;  Surgeon: Golda Claudis PENNER, MD;  Location: [facility];  Service: Endoscopy;;   COLONOSCOPY WITH PROPOFOL  N/A 01/06/2021   Procedure: COLONOSCOPY WITH PROPOFOL ;  Surgeon: Golda Claudis PENNER, MD;  Location: [facility];  Service: Endoscopy;  Laterality: N/A;  1:20   ESOPHAGOGASTRODUODENOSCOPY (EGD) WITH PROPOFOL  N/A 01/06/2021   Procedure: ESOPHAGOGASTRODUODENOSCOPY (EGD) WITH PROPOFOL ;  Surgeon: Golda Claudis PENNER, MD;  Location: [facility];  Service: Endoscopy;  Laterality: N/A;    Family History  Problem Relation Age of Onset   Hypertension Mother    Cancer Father        lung   Cancer Maternal Grandmother        breast    Migraines Maternal Grandmother    Cancer Maternal Grandfather        pancreatic and bone   Alzheimer's disease Paternal Grandmother    Cancer Paternal Grandfather     Social History   Socioeconomic History   Marital status: Single    Spouse name: Not on file   Number of children: Not on file   Years of education: Not on file   Highest education level: Associate degree: occupational, Scientist, product/process development, or vocational program  Occupational History   Not on file  Tobacco Use   Smoking status: Never   Smokeless tobacco: Never  Vaping Use   Vaping status: Never Used  Substance and Sexual Activity   Alcohol use: Not Currently  Comment: monthly   Drug use: No   Sexual activity: Yes    Birth control/protection: I.U.D.  Other Topics Concern   Not on file  Social History Narrative   Lives with mom, step dad      Pets: 3 dogs: Skimp, Ginger, and Evie May      Enjoys: read-genre: poetry, movies and music      Diet: eats all food groups, snacks with stress   Caffeine : coffee-2 cups; tea daily, mountain dew    Water: 1-2 cups daily       Does not wear seat belt due to being anxiety    Does not use phone while driving   Psychologist, sport and exercise at home    Social Drivers of Health   Financial Resource Strain: Not on file  Food Insecurity: Not on  file  Transportation Needs: Not on file  Physical Activity: Not on file  Stress: Not on file  Social Connections: Not on file  Intimate Partner Violence: Not on file    Outpatient Medications Prior to Visit  Medication Sig Dispense Refill   acetaminophen  (TYLENOL ) 500 MG tablet Take 500-1,000 mg by mouth every 6 (six) hours as needed for moderate pain or headache.     ALPRAZolam  (XANAX ) 0.25 MG tablet Take 1 tablet (0.25 mg total) by mouth 2 (two) times daily as needed for anxiety (MRI medication). 2 tabs po 1 hour before MRI , and  2 pills at 10 minutes prior  to MRI. 5 tablet 0   Cyanocobalamin (VITAMIN B-12) 3000 MCG/ML LIQD Take 3,000 mcg by mouth daily.     hydrOXYzine  (VISTARIL ) 25 MG capsule Take 1 capsule (25 mg total) by mouth every 8 (eight) hours as needed for anxiety. 30 capsule 2   hyoscyamine  (LEVBID ) 0.375 MG 12 hr tablet TAKE 1 TABLET BY MOUTH EVERY 12 HOURS AS NEEDED FOR CRAMPING. 180 tablet 1   Multiple Vitamin (MULTIVITAMIN) tablet Take 1 tablet by mouth daily.     SUMAtriptan  (IMITREX ) 100 MG tablet TAKE 1 TAB BY MOUTH EVERY 2 HOURS AS NEEDED FOR MIGRAINE. MAY REPEAT IN 2 HOURS IF HEADACHE PERSISTS OR RECURS. 10 tablet 2   venlafaxine  XR (EFFEXOR -XR) 75 MG 24 hr capsule TAKE 1 CAPSULE BY MOUTH DAILY WITH BREAKFAST. 90 capsule 2   amLODipine  (NORVASC ) 5 MG tablet TAKE 1 TABLET (5 MG TOTAL) BY MOUTH DAILY. 90 tablet 1   verapamil  (CALAN -SR) 120 MG CR tablet Take 1 tablet (120 mg total) by mouth at bedtime. 30 tablet 5   No facility-administered medications prior to visit.    Allergies  Allergen Reactions   Amoxicillin Rash   Penicillins Rash   Doxycycline  Hives   Ketorolac  Hives   Rocephin  [Ceftriaxone ] Hives   Dicyclomine  Rash   Latex Itching    ROS Review of Systems  Constitutional:  Positive for fatigue. Negative for chills and fever.  HENT:  Negative for congestion, sinus pressure, sinus pain and sore throat.   Eyes:  Negative for pain and discharge.   Respiratory:  Negative for cough and shortness of breath.   Cardiovascular:  Negative for chest pain and palpitations.  Gastrointestinal:  Positive for constipation. Negative for nausea and vomiting.  Endocrine: Negative for polydipsia and polyuria.  Genitourinary:  Negative for dysuria and hematuria.  Musculoskeletal:  Negative for neck pain and neck stiffness.  Skin:  Negative for rash.  Neurological:  Positive for headaches. Negative for dizziness and weakness.  Psychiatric/Behavioral:  Negative for agitation and behavioral problems. The patient  is nervous/anxious.       Objective:    Physical Exam Vitals reviewed.  Constitutional:      General: She is not in acute distress.    Appearance: She is not diaphoretic.  HENT:     Head: Normocephalic and atraumatic.     Nose: Nose normal. No congestion.     Mouth/Throat:     Mouth: Mucous membranes are moist.     Pharynx: No posterior oropharyngeal erythema.  Eyes:     General: No scleral icterus.    Extraocular Movements: Extraocular movements intact.  Cardiovascular:     Rate and Rhythm: Normal rate and regular rhythm.     Heart sounds: Normal heart sounds. No murmur heard. Pulmonary:     Breath sounds: Normal breath sounds. No wheezing or rales.  Musculoskeletal:     Cervical back: Neck supple. No tenderness.     Right lower leg: No edema.     Left lower leg: No edema.  Skin:    General: Skin is warm.     Findings: No rash.  Neurological:     General: No focal deficit present.     Mental Status: She is alert and oriented to person, place, and time.     Sensory: No sensory deficit.     Motor: Tremor present. No weakness.  Psychiatric:        Mood and Affect: Mood normal.        Behavior: Behavior normal.     BP 138/86 (BP Location: Left Arm)   Pulse 100   Ht 5' 10 (1.778 m)   Wt 209 lb (94.8 kg)   SpO2 98%   BMI 29.99 kg/m  Wt Readings from Last 3 Encounters:  03/05/24 209 lb (94.8 kg)  10/24/23 202 lb  (91.6 kg)  10/23/23 201 lb 12.8 oz (91.5 kg)    Lab Results  Component Value Date   TSH 1.730 03/07/2022   Lab Results  Component Value Date   WBC 7.7 09/30/2023   HGB 12.9 09/30/2023   HCT 38.0 09/30/2023   MCV 88.4 09/30/2023   PLT 257 09/30/2023   Lab Results  Component Value Date   NA 141 09/30/2023   K 3.4 (L) 09/30/2023   CO2 21 (L) 09/30/2023   GLUCOSE 106 (H) 09/30/2023   BUN 6 09/30/2023   CREATININE 0.60 09/30/2023   BILITOT 0.4 09/30/2023   ALKPHOS 47 09/30/2023   AST 16 09/30/2023   ALT 15 09/30/2023   PROT 7.4 09/30/2023   ALBUMIN 3.9 09/30/2023   CALCIUM 8.9 09/30/2023   ANIONGAP 11 09/30/2023   EGFR 117 03/24/2023   Lab Results  Component Value Date   CHOL 143 03/07/2022   Lab Results  Component Value Date   HDL 45 03/07/2022   Lab Results  Component Value Date   LDLCALC 85 03/07/2022   Lab Results  Component Value Date   TRIG 61 03/07/2022   Lab Results  Component Value Date   CHOLHDL 3.2 03/07/2022   Lab Results  Component Value Date   HGBA1C 4.9 03/07/2022      Assessment & Plan:   Problem List Items Addressed This Visit       Cardiovascular and Mediastinum   Essential hypertension - Primary   BP Readings from Last 1 Encounters:  03/05/24 138/86   Well-controlled now with Amlodipine  5 mg once daily Has tachycardia, likely due to situational anxiety - has Verapamil  for migraine now Due to amlodipine  and verapamil  both being  calcium channel blockers, discontinue amlodipine , increase dose of verapamil  to 180 mg QD instead, which can also help with palpitations Counseled for compliance with the medications Advised DASH diet and moderate exercise/walking, at least 150 mins/week      Relevant Medications   verapamil  (CALAN -SR) 180 MG CR tablet   Hemiplegic migraine, not intractable, without status migrainosus   Maxalt  was ineffective Has sumatriptan  as needed for now Has Verapamil  for migraine PPx, can also help with  situational anxiety and HTN Followed by [facility]      Relevant Medications   verapamil  (CALAN -SR) 180 MG CR tablet     Digestive   Irritable bowel syndrome with both constipation and diarrhea   Takes Hyoscyamine  PRN for abdominal cramping Has episodes of constipation, advised to take MiraLAX as needed and to avoid hyoscyamine  when she has constipation        Other   Panic attacks   Overall well-controlled with Effexor  Has Vistaril  as needed for spells of anxiety/panic episode           Meds ordered this encounter  Medications   verapamil  (CALAN -SR) 180 MG CR tablet    Sig: Take 1 tablet (180 mg total) by mouth daily.    Dispense:  30 tablet    Refill:  5    Follow-up: Return in about 6 months (around 09/05/2024) for HTN.    Suzzane MARLA Blanch, MD

## 2024-03-25 ENCOUNTER — Telehealth: Payer: Self-pay

## 2024-03-25 ENCOUNTER — Telehealth: Payer: Self-pay | Admitting: Neurology

## 2024-03-25 NOTE — Telephone Encounter (Signed)
 Error

## 2024-03-25 NOTE — Telephone Encounter (Signed)
 Patient reschedule appointment due have not had MRI done yet due to no insurance.

## 2024-03-26 ENCOUNTER — Ambulatory Visit: Admitting: Neurology

## 2024-03-29 ENCOUNTER — Other Ambulatory Visit: Payer: Self-pay | Admitting: Internal Medicine

## 2024-03-29 DIAGNOSIS — F411 Generalized anxiety disorder: Secondary | ICD-10-CM

## 2024-04-02 ENCOUNTER — Other Ambulatory Visit: Payer: Self-pay | Admitting: Internal Medicine

## 2024-04-02 DIAGNOSIS — K582 Mixed irritable bowel syndrome: Secondary | ICD-10-CM

## 2024-08-27 ENCOUNTER — Ambulatory Visit: Admitting: Neurology

## 2024-09-05 ENCOUNTER — Ambulatory Visit: Payer: Self-pay | Admitting: Internal Medicine
